# Patient Record
Sex: Female | Born: 1955 | Race: White | Hispanic: Refuse to answer | Smoking: Never smoker
Health system: Southern US, Community
[De-identification: ages and names within clinical notes are randomized; demographics above are authoritative.]

## PROBLEM LIST (undated history)

## (undated) DIAGNOSIS — E785 Hyperlipidemia, unspecified: Secondary | ICD-10-CM

## (undated) DIAGNOSIS — R87619 Unspecified abnormal cytological findings in specimens from cervix uteri: Secondary | ICD-10-CM

## (undated) DIAGNOSIS — I10 Essential (primary) hypertension: Secondary | ICD-10-CM

## (undated) DIAGNOSIS — D126 Benign neoplasm of colon, unspecified: Secondary | ICD-10-CM

## (undated) DIAGNOSIS — K219 Gastro-esophageal reflux disease without esophagitis: Secondary | ICD-10-CM

## (undated) DIAGNOSIS — T7840XA Allergy, unspecified, initial encounter: Secondary | ICD-10-CM

## (undated) HISTORY — DX: Hyperlipidemia, unspecified: E78.5

## (undated) HISTORY — PX: POLYPECTOMY: SHX149

## (undated) HISTORY — PX: BREAST ENHANCEMENT SURGERY: SHX7

## (undated) HISTORY — PX: UPPER GASTROINTESTINAL ENDOSCOPY: SHX188

## (undated) HISTORY — DX: Gastro-esophageal reflux disease without esophagitis: K21.9

## (undated) HISTORY — DX: Allergy, unspecified, initial encounter: T78.40XA

## (undated) HISTORY — DX: Benign neoplasm of colon, unspecified: D12.6

## (undated) HISTORY — PX: COLONOSCOPY: SHX174

## (undated) HISTORY — PX: FACIAL COSMETIC SURGERY: SHX629

## (undated) HISTORY — DX: Essential (primary) hypertension: I10

## (undated) HISTORY — PX: WISDOM TOOTH EXTRACTION: SHX21

## (undated) HISTORY — DX: Unspecified abnormal cytological findings in specimens from cervix uteri: R87.619

## (undated) HISTORY — PX: OTHER SURGICAL HISTORY: SHX169

## (undated) HISTORY — PX: ABDOMINOPLASTY: SUR9

## (undated) HISTORY — PX: COSMETIC SURGERY: SHX468

---

## 1999-12-29 HISTORY — PX: CRYOTHERAPY: SHX1416

## 2005-02-27 ENCOUNTER — Ambulatory Visit: Payer: Self-pay | Admitting: Internal Medicine

## 2005-10-13 ENCOUNTER — Ambulatory Visit: Payer: Self-pay | Admitting: Internal Medicine

## 2006-08-04 ENCOUNTER — Ambulatory Visit: Payer: Self-pay | Admitting: Internal Medicine

## 2006-08-27 ENCOUNTER — Ambulatory Visit: Payer: Self-pay | Admitting: Internal Medicine

## 2006-10-13 ENCOUNTER — Ambulatory Visit: Payer: Self-pay | Admitting: Internal Medicine

## 2007-10-29 ENCOUNTER — Encounter: Payer: Self-pay | Admitting: Internal Medicine

## 2007-10-29 DIAGNOSIS — K200 Eosinophilic esophagitis: Secondary | ICD-10-CM

## 2007-10-29 DIAGNOSIS — J309 Allergic rhinitis, unspecified: Secondary | ICD-10-CM | POA: Insufficient documentation

## 2007-11-01 ENCOUNTER — Ambulatory Visit: Payer: Self-pay | Admitting: Internal Medicine

## 2009-09-09 ENCOUNTER — Encounter (INDEPENDENT_AMBULATORY_CARE_PROVIDER_SITE_OTHER): Payer: Self-pay | Admitting: *Deleted

## 2010-05-16 ENCOUNTER — Telehealth: Payer: Self-pay | Admitting: Internal Medicine

## 2011-01-27 NOTE — Progress Notes (Signed)
Summary: Schedule Colonoscopy  Phone Note Outgoing Call Call back at Home Phone [phone]   Call placed by: Harlow Mares CMA Duncan Dull),  May 16, 2010 2:49 PM Call placed to: Patient Summary of Call: Left message on patients machine to call back. patient due for colonosocpy.  Initial call taken by: Harlow Mares CMA Duncan Dull),  May 16, 2010 2:49 PM  Follow-up for Phone Call        Left message on patients machine to call back. we will mail a letter to remind her she is due for a colonoscopy Follow-up by: Harlow Mares CMA Duncan Dull),  June 09, 2010 12:47 PM

## 2011-05-15 NOTE — Assessment & Plan Note (Signed)
[facility]                               PULMONARY OFFICE NOTE   Kimberly Collier, DRISCOLL                     MRN:          [id number]  DATE:08/04/2006                            DOB:          1956/06/30    PROBLEM:  The patient is a 55 year old woman referred through the courtesy  of Dr. Vernell Barrier for allergy evaluation, with concern of eosinophilic  esophagitis.   HISTORY:  She has been followed in this practice by Dr. Illene Regulus for  primary care and had a prior colonoscopy by Dr. Lina Sar.  She happened  to be in [facility] on May 28, 2006 when she presented to the [facility]  emergency room with meat impaction in the esophagus.  Dr. Vernell Barrier did  endoscopy with relief.  She was found to have an esophageal stricture with  biopsy ([id number]) at [facility], describing numerous  eosinophils consistent with eosinophilic esophagitis.  Tests were negative  for Barrett's esophagus and for Candida.  Gastric biopsy stain negative for  Helicobacter.  Other findings were a hiatal hernia, gastric and duodenal  ulcers with duodenal erosions.  She was treated with Nexium 40 mg b.i.d. and  scheduled for a followup dilatation.  She indicates she would follow up for  GI in the future with Dr. Juanda Chance because she lives here.  She also has noted  nasal congestion and some sensitivity to perfumes and strong odors.  She has  been told in the past that she might have cough variant asthma based on a  tendency to persistent cough and chest congestion after winter viral  syndromes.  She describes the primary event of a solid food impaction as  having happened two or three times before, usually with chicken.  This time,  it was with beef.  It has always been with meat.  She has not been aware of  other allergic manifestations such as hives, gas, or cramping associated  with particular foods, and she had not had other suggestion of asthma.   REVIEW OF SYSTEMS:  Some nonproductive cough without wheeze.  Difficulty  swallowing.  Intermittent nasal congestion that may be somewhat seasonal.  Some strong fragrances cause headaches, and she has to avoid cigarette  smoke.   MEDICATIONS:  1. Multivitamin.  2. Vesicare 5 mg.  3. Aleve.   ALLERGIES:  NO MEDICATION ALLERGY.   PAST MEDICAL HISTORY:  1. Previous episodes of difficulty swallowing meat bolus, usually      associated with chicken in the past.  2. Some hoarseness notable to her because she has to speak frequently in      her work.  3. No history of atopic dermatitis, eczema, or other rash.  No history of      unusual reaction to insects or to other foods, latex, or aspirin.  She      does not think she has ever had an iodine contrast type medication.  4. Postviral bronchitis pattern with winter colds.  She says at one point      was described to her as cough variant asthma.  5. Aesthetic surgery, including implants.  6. Upper endoscopy per HPI.   Note that she says that these episodes have happened three or four times  over a period of 10 years, always with restaurants.  On the first occasion,  no alcohol was involved.  Other times, she had alcohol with meal.   SOCIAL HISTORY:  She now lives in an apartment.  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  One  alcoholic beverage daily.  No tobacco.   OBJECTIVE:  VITAL SIGNS:  Weight 137 pounds.  Blood pressure 132/94, pulse  regular at 89, room air saturation 97%.  GENERAL:  This is an alert, comfortable-appearing woman.  SKIN:  No evident rash.  ADENOPATHY:  None found at the neck, shoulders, or axillae.  HEENT:  Conjunctivae clear.  Nasal mucosa clear and unobstructed.  Pharynx  looks somewhat more reddened and glandular than normal but not dramatically  so, with no postnasal drainage.  Voice may be very slightly hoarse, without  stridor.  NECK:  There is no neck  vein distension or thyromegaly.  CHEST:  Quiet, clear lung fields, unlabored breathing.  HEART:  Heart sounds regular without murmur or gallop.  ABDOMEN:  No enlargement of liver or spleen.  EXTREMITIES:  No clubbing, cyanosis, or edema.   IMPRESSION:  1. Eosinophilic esophagitis which is nonspecific and of uncertain      significance as a marker of allergic response to food.  This issue is      being discussed in literature.  Consider the possibility that her      stricture at least in part might reflect in immune response.  2. Esophageal stricture, hiatal hernia, gastric and duodenal ulceration      and erosion.  Negative for Helicobacter on biopsy.  She will definitely      need gastroenterology followup.  3. Possible mild allergic rhinitis.  4. Nonspecific sensitivity to irritants, including strong odors and      tobacco smoke.  5. Mild hoarseness, most likely reflects her gastrointestinal problems and      some tendency towards reflux that she has not been recognizing, but I      cannot exclude a component of postnasal drainage.   PLAN:  1. Food RAST panel.  2. Sample of Veramyst.  3. She understands to chew carefully, and she is going to be avoiding      foods she thinks are likely to cause esophageal distress.  4. Schedule return in three weeks to follow up labs, with option to do      allergy skin testing if appropriate at that time.   I appreciate the chance to meet this woman and hope I can be helpful.                                   Clinton D. Maple Hudson, MD, [facility], FACP   CDY/MedQ  DD:  08/08/2006  DT:  08/09/2006  Job #:  [id number]   cc:   Gaylyn Lambert, M.D.  Rosalyn Gess Norins, MD  Hedwig Morton. Juanda Chance, MD

## 2011-05-15 NOTE — Assessment & Plan Note (Signed)
[facility]                               PULMONARY OFFICE NOTE   Kimberly Collier, Kimberly Collier                     MRN:          [id number]  DATE:08/27/2006                            DOB:          07/04/1956    PROBLEMS:  1. Eosinophilic esophagitis.  2. Esophageal stricture/hiatal hernia/gastric and duodenal ulceration and      erosion, Helicobacter negative.  3. Possible mild allergic rhinitis.  4. Nonspecific respiratory irritant sensitivity.   HISTORY:  She had a viral respiratory syndrome after the last visit, which  has resolved completely.  Because of that, she did not really attempt to  assess the Veramyst and has not used that as a nasal steroid spray.  Rhinitis symptoms have been minimal and apparently the hoarseness has not  been significant.  She has had no more GI complaints or difficulty  swallowing.   MEDICATIONS:  Multivitamins, Vesicare, Aleve.   ALLERGIES:  No medication allergy.   OBJECTIVE:  VITAL SIGNS:  Weight 136 pounds, BP 132/86, pulse regular at 83,  room air saturation 96%.  GENERAL:  She looks comfortable.  HEENT:  Speech is clear.  Nasal airway does not seem obstructed.  Voice  quality is normal.  Breathing is unlabored.  LUNGS:  Lung fields are clear.  CARDIAC:  Heart sounds are regular without murmur.   LABORATORY:  A screening food allergy in vitro assay was negative for egg  white, milk, wheat, soybean, peanut and fish.  Her total IgE level was  moderate at 103.4 (0-180).  Sedimentation rate was normal at 2.   IMPRESSION:  I told her we are really not sure of what eosinophilic  infiltration in the esophagus means, especially in the absence of other  symptoms suggestive of an allergic response.  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  Her symptomatic context was eating meat when she was  out on social occasions, where she was also having alcohol and probably  under a little pressure to speak a little  quickly and chew less carefully.  We could offer skin testing for foods or check on availability of double-  blind food assay in capsules, but I am not sure that that really is a useful  direction for her at this time.  It is more important that she maintain  control of the more common esophagitis and gastritis findings.   PLAN:  1. She is going to check with Dr. Lina Sar about advice for followup      with GI, possibly including a repeat esophagoscopy.  2. We agreed on the simple measures of avoiding difficult-to-chew foods      when she is out with others, favoring fish, which at least is less      likely to hang in a strictured esophagus, and she can try taking a      Claritin or an hour or so before meals if she wishes, although I am not      sure what impact that would have.  I doubt that an allergic food      response is a  significant part of what she has experienced, but I would      be happy to see her again if I can be helpful.                                   Clinton D. Maple Hudson, MD, [facility], FACP   CDY/MedQ  DD:  08/28/2006  DT:  08/30/2006  Job #:  [id number]   cc:   Kimberly Lambert, MD  Kimberly Gess. Norins, MD  Kimberly Morton. Juanda Chance, MD

## 2011-07-21 ENCOUNTER — Ambulatory Visit (AMBULATORY_SURGERY_CENTER): Payer: PRIVATE HEALTH INSURANCE | Admitting: *Deleted

## 2011-07-21 VITALS — Ht 65.0 in | Wt 119.0 lb

## 2011-07-21 DIAGNOSIS — Z1211 Encounter for screening for malignant neoplasm of colon: Secondary | ICD-10-CM

## 2011-07-21 MED ORDER — PEG-KCL-NACL-NASULF-NA ASC-C 100 G PO SOLR: ORAL | Status: DC

## 2011-08-04 ENCOUNTER — Ambulatory Visit (AMBULATORY_SURGERY_CENTER): Payer: PRIVATE HEALTH INSURANCE | Admitting: Internal Medicine

## 2011-08-04 ENCOUNTER — Encounter: Payer: Self-pay | Admitting: Internal Medicine

## 2011-08-04 DIAGNOSIS — K635 Polyp of colon: Secondary | ICD-10-CM

## 2011-08-04 DIAGNOSIS — D126 Benign neoplasm of colon, unspecified: Secondary | ICD-10-CM

## 2011-08-04 DIAGNOSIS — D129 Benign neoplasm of anus and anal canal: Secondary | ICD-10-CM

## 2011-08-04 DIAGNOSIS — Z86010 Personal history of colonic polyps: Secondary | ICD-10-CM

## 2011-08-04 DIAGNOSIS — Z1211 Encounter for screening for malignant neoplasm of colon: Secondary | ICD-10-CM

## 2011-08-04 DIAGNOSIS — D128 Benign neoplasm of rectum: Secondary | ICD-10-CM

## 2011-08-04 MED ORDER — SODIUM CHLORIDE 0.9 % IV SOLN: 500.0000 mL | INTRAVENOUS | Status: DC

## 2011-08-04 NOTE — Patient Instructions (Signed)
Discharge instructions given to the patient. Handouts on polyps and high-fiber diet given. Resume previous medications.

## 2011-08-05 ENCOUNTER — Telehealth: Payer: Self-pay

## 2011-08-05 NOTE — Telephone Encounter (Signed)
Left message on answering machine. 

## 2011-08-10 ENCOUNTER — Encounter: Payer: Self-pay | Admitting: Internal Medicine

## 2014-01-07 ENCOUNTER — Ambulatory Visit (INDEPENDENT_AMBULATORY_CARE_PROVIDER_SITE_OTHER): Payer: No Typology Code available for payment source | Admitting: Family Medicine

## 2014-01-07 VITALS — BP 122/70 | HR 108 | Temp 99.1°F | Resp 18 | Wt 132.0 lb

## 2014-01-07 DIAGNOSIS — R05 Cough: Secondary | ICD-10-CM

## 2014-01-07 DIAGNOSIS — R0981 Nasal congestion: Secondary | ICD-10-CM

## 2014-01-07 DIAGNOSIS — J209 Acute bronchitis, unspecified: Secondary | ICD-10-CM

## 2014-01-07 DIAGNOSIS — J3489 Other specified disorders of nose and nasal sinuses: Secondary | ICD-10-CM

## 2014-01-07 DIAGNOSIS — R509 Fever, unspecified: Secondary | ICD-10-CM

## 2014-01-07 DIAGNOSIS — R059 Cough, unspecified: Secondary | ICD-10-CM

## 2014-01-07 LAB — POCT INFLUENZA A/B
Influenza A, POC: NEGATIVE
Influenza B, POC: NEGATIVE

## 2014-01-07 MED ORDER — FLUTICASONE PROPIONATE 50 MCG/ACT NA SUSP: 2.0000 | Freq: Every day | NASAL | Status: DC

## 2014-01-07 MED ORDER — HYDROCODONE-HOMATROPINE 5-1.5 MG/5ML PO SYRP: 5.0000 mL | ORAL_SOLUTION | Freq: Every evening | ORAL | Status: DC | PRN

## 2014-01-07 MED ORDER — AZITHROMYCIN 250 MG PO TABS: ORAL_TABLET | ORAL | Status: DC

## 2014-01-07 MED ORDER — BENZONATATE 100 MG PO CAPS: 200.0000 mg | ORAL_CAPSULE | Freq: Two times a day (BID) | ORAL | Status: DC | PRN

## 2014-01-07 NOTE — Patient Instructions (Signed)
Acute Bronchitis Bronchitis is inflammation of the airways that extend from the windpipe into the lungs (bronchi). The inflammation often causes mucus to develop. This leads to a cough, which is the most common symptom of bronchitis.  In acute bronchitis, the condition usually develops suddenly and goes away over time, usually in a couple weeks. Smoking, allergies, and asthma can make bronchitis worse. Repeated episodes of bronchitis may cause further lung problems.  CAUSES Acute bronchitis is most often caused by the same virus that causes a cold. The virus can spread from person to person (contagious).  SIGNS AND SYMPTOMS   Cough.   Fever.   Coughing up mucus.   Body aches.   Chest congestion.   Chills.   Shortness of breath.   Sore throat.  DIAGNOSIS  Acute bronchitis is usually diagnosed through a physical exam. Tests, such as chest X-rays, are sometimes done to rule out other conditions.  TREATMENT  Acute bronchitis usually goes away in a couple weeks. Often times, no medical treatment is necessary. Medicines are sometimes given for relief of fever or cough. Antibiotics are usually not needed but may be prescribed in certain situations. In some cases, an inhaler may be recommended to help reduce shortness of breath and control the cough. A cool mist vaporizer may also be used to help thin bronchial secretions and make it easier to clear the chest.  HOME CARE INSTRUCTIONS  Get plenty of rest.   Drink enough fluids to keep your urine clear or pale yellow (unless you have a medical condition that requires fluid restriction). Increasing fluids may help thin your secretions and will prevent dehydration.   Only take over-the-counter or prescription medicines as directed by your health care provider.   Avoid smoking and secondhand smoke. Exposure to cigarette smoke or irritating chemicals will make bronchitis worse. If you are a smoker, consider using nicotine gum or skin  patches to help control withdrawal symptoms. Quitting smoking will help your lungs heal faster.   Reduce the chances of another bout of acute bronchitis by washing your hands frequently, avoiding people with cold symptoms, and trying not to touch your hands to your mouth, nose, or eyes.   Follow up with your health care provider as directed.  SEEK MEDICAL CARE IF: Your symptoms do not improve after 1 week of treatment.  SEEK IMMEDIATE MEDICAL CARE IF:  You develop an increased fever or chills.   You have chest pain.   You have severe shortness of breath.  You have bloody sputum.   You develop dehydration.  You develop fainting.  You develop repeated vomiting.  You develop a severe headache. MAKE SURE YOU:   Understand these instructions.  Will watch your condition.  Will get help right away if you are not doing well or get worse. Document Released: 01/21/2005 Document Revised: 08/16/2013 Document Reviewed: 06/06/2013 [employer] Patient Information 2014 [employer].  

## 2014-01-07 NOTE — Progress Notes (Deleted)
new

## 2014-01-07 NOTE — Progress Notes (Signed)
Chief Complaint:  Chief Complaint  Patient presents with  . Nasal Congestion    x11 days   . Fatigue  . Laryngitis  . Fever    HPI: Kimberly Collier is a 58 y.o. female who is here for fever, congestion, laryngitis for 11 days.  Has tried zicam, cholracetin. She denies smoking, has had bronchitis in the past; denies SOB, +  wheezing No ear pain or facial pain. She thought it was viral and has waited it out but feeling somewhat worse. She does have allergies.   Past Medical History  Diagnosis Date  . Allergy     pollen in spring  . Hyperlipidemia   . Hypertension   . Adenomatous colon polyp    Past Surgical History  Procedure Laterality Date  . Colonoscopy    . Breast enhancement surgery    . Tummy    . Abdominoplasty    . Facial cosmetic surgery    . Upper gastrointestinal endoscopy    . Cosmetic surgery     History   Social History  . Marital Status: Divorced    Spouse Name: N/A    Number of Children: N/A  . Years of Education: N/A   Social History Main Topics  . Smoking status: Never Smoker   . Smokeless tobacco: Never Used  . Alcohol Use: 4.2 oz/week    7 Glasses of wine per week  . Drug Use: No  . Sexual Activity: None   Other Topics Concern  . None   Social History Narrative  . None   Family History  Problem Relation Age of Onset  . Hyperlipidemia Father   . Hypertension Father    Allergies  Allergen Reactions  . Celebrex [Celecoxib] Rash   Prior to Admission medications   Medication Sig Start Date End Date Taking? Authorizing Provider  b complex vitamins tablet Take 1 tablet by mouth daily.     Yes Historical Provider, MD  calcium & magnesium carbonates (MYLANTA) 311-232 MG per tablet Take 1 tablet by mouth daily.     Yes Historical Provider, MD  Cholecalciferol (VITAMIN D) 2000 UNITS tablet Take 2,000 Units by mouth daily.     Yes Historical Provider, MD  fish oil-omega-3 fatty acids 1000 MG capsule Take 1 g by mouth daily.     Yes  Historical Provider, MD  losartan-hydrochlorothiazide (HYZAAR) 50-12.5 MG per tablet Take 1 tablet by mouth Daily. 07/16/11  Yes Historical Provider, MD  Multiple Vitamins-Minerals (MULTIVITAMIN WITH MINERALS) tablet Take 1 tablet by mouth daily.     Yes Historical Provider, MD  pravastatin (PRAVACHOL) 20 MG tablet Take 20 mg by mouth Daily. 07/16/11  Yes Historical Provider, MD  VITAMIN A PO Take 1 tablet by mouth daily.     Yes Historical Provider, MD  vitamin B-12 (CYANOCOBALAMIN) 500 MCG tablet Take 500 mcg by mouth daily.     Yes Historical Provider, MD  vitamin C (ASCORBIC ACID) 500 MG tablet Take 500 mg by mouth daily.     Yes Historical Provider, MD  vitamin E (VITAMIN E) 400 UNIT capsule Take 400 Units by mouth daily.     Yes Historical Provider, MD  vitamin k 100 MCG tablet Take 100 mcg by mouth daily.     Yes Historical Provider, MD  peg 3350 powder (MOVIPREP) 100 G SOLR MOVI PREP take as directed 07/21/11   Lafayette Dragon, MD     ROS: The patient denies night sweats, unintentional weight loss, chest  pain, palpitations, dyspnea on exertion, nausea, vomiting, abdominal pain, dysuria, hematuria, melena, numbness, weakness, or tingling.  All other systems have been reviewed and were otherwise negative with the exception of those mentioned in the HPI and as above.    PHYSICAL EXAM: Filed Vitals:   01/07/14 1227  BP: 122/70  Pulse: 108  Temp: 99.1 F (37.3 C)  Resp: 18   Filed Vitals:   01/07/14 1227  Weight: 132 lb (59.875 kg)   Body mass index is 21.97 kg/(m^2).  General: Alert, no acute distress HEENT:  Normocephalic, atraumatic, oropharynx patent. EOMI, PERRLA. Tm nl, erythematous throat, no exudates, minimally tender sinuses Cardiovascular:  Regular rate and rhythm, no rubs murmurs or gallops.  No Carotid bruits, radial pulse intact. No pedal edema.  Respiratory: Clear to auscultation bilaterally.  No wheezes, rales, or rhonchi.  No cyanosis, no use of accessory  musculature GI: No organomegaly, abdomen is soft and non-tender, positive bowel sounds.  No masses. Skin: No rashes. Neurologic: Facial musculature symmetric. Psychiatric: Patient is appropriate throughout our interaction. Lymphatic: No cervical lymphadenopathy Musculoskeletal: Gait intact.   LABS: Results for orders placed in visit on 01/07/14  POCT INFLUENZA A/B      Result Value Range   Influenza A, POC Negative     Influenza B, POC Negative       EKG/XRAY:   Primary read interpreted by Dr. Marin Comment at [facility].   ASSESSMENT/PLAN: Encounter Diagnoses  Name Primary?  . Chills with fever Yes  . Acute bronchitis   . Nasal congestion   . Cough    Most likely viral but since it has been over 11 days and patient is feeling worse advise to try sxs first and if no improvement then may use abx.  Rx Azithromycin Rx Tessalon Perles, Flonase and hycodan F/u prn  Gross sideeffects, risk and benefits, and alternatives of medications d/w patient. Patient is aware that all medications have potential sideeffects and we are unable to predict every sideeffect or drug-drug interaction that may occur.  LE, [place], DO 01/07/2014 1:39 PM

## 2014-04-21 ENCOUNTER — Emergency Department (HOSPITAL_COMMUNITY)
Admission: EM | Admit: 2014-04-21 | Discharge: 2014-04-21 | Disposition: A | Discharge status: Home / Self Care | Payer: PRIVATE HEALTH INSURANCE | Attending: Emergency Medicine | Admitting: Emergency Medicine

## 2014-04-21 ENCOUNTER — Encounter (HOSPITAL_COMMUNITY): Payer: Self-pay | Admitting: Emergency Medicine

## 2014-04-21 DIAGNOSIS — T781XXA Other adverse food reactions, not elsewhere classified, initial encounter: Secondary | ICD-10-CM | POA: Insufficient documentation

## 2014-04-21 DIAGNOSIS — Y9389 Activity, other specified: Secondary | ICD-10-CM | POA: Insufficient documentation

## 2014-04-21 DIAGNOSIS — Y929 Unspecified place or not applicable: Secondary | ICD-10-CM | POA: Insufficient documentation

## 2014-04-21 DIAGNOSIS — Z8639 Personal history of other endocrine, nutritional and metabolic disease: Secondary | ICD-10-CM | POA: Insufficient documentation

## 2014-04-21 DIAGNOSIS — Z79899 Other long term (current) drug therapy: Secondary | ICD-10-CM | POA: Insufficient documentation

## 2014-04-21 DIAGNOSIS — Z860100 Personal history of colon polyps, unspecified: Secondary | ICD-10-CM | POA: Insufficient documentation

## 2014-04-21 DIAGNOSIS — Z862 Personal history of diseases of the blood and blood-forming organs and certain disorders involving the immune mechanism: Secondary | ICD-10-CM | POA: Insufficient documentation

## 2014-04-21 DIAGNOSIS — T628X1A Toxic effect of other specified noxious substances eaten as food, accidental (unintentional), initial encounter: Secondary | ICD-10-CM | POA: Insufficient documentation

## 2014-04-21 DIAGNOSIS — T7840XA Allergy, unspecified, initial encounter: Secondary | ICD-10-CM

## 2014-04-21 DIAGNOSIS — I10 Essential (primary) hypertension: Secondary | ICD-10-CM | POA: Insufficient documentation

## 2014-04-21 MED ORDER — EPINEPHRINE 0.3 MG/0.3ML IJ SOAJ: 0.3000 mg | Freq: Once | INTRAMUSCULAR | Status: AC

## 2014-04-21 NOTE — Discharge Instructions (Signed)
Return here for any trouble swallowing, inability to control your secretions, rash, or any other problems. Epinephrine Injection Epinephrine is a medicine given by injection to temporarily treat an emergency allergic reaction. It is also used to treat severe asthmatic attacks and other lung problems. The medicine helps to enlarge (dilate) the small breathing tubes of the lungs. A life-threatening, sudden allergic reaction that involves the whole body is called anaphylaxis. Because of potential side effects, epinephrine should only be used as directed by your caregiver. RISKS AND COMPLICATIONS Possible side effects of epinephrine injections include:  Chest pain.  Irregular or rapid heartbeat.  Shortness of breath.  Nausea.  Vomiting.  Abdominal pain or cramping.  Sweating.  Dizziness.  Weakness.  Headache.  Nervousness. Report all side effects to your caregiver. HOW TO GIVE AN EPINEPHRINE INJECTION Give the epinephrine injection immediately when symptoms of a severe reaction begin. Inject the medicine into the outer thigh or any available, large muscle. Your caregiver can teach you how to do this. You do not need to remove any clothing. After the injection, call your local emergency services (911 in [place] Even if you improve after the injection, you need to be examined at a hospital emergency department. Epinephrine works quickly, but it also wears off quickly. Delayed reactions can occur. A delayed reaction may be as serious and dangerous as the initial reaction. HOME CARE INSTRUCTIONS  Make sure you and your family know how to give an epinephrine injection.  Use epinephrine injections as directed by your caregiver. Do not use this medicine more often or in larger doses than prescribed.  Always carry your epinephrine injection or anaphylaxis kit with you. This can be lifesaving if you have a severe reaction.  Store the medicine in a cool, dry place. If the medicine becomes  discolored or cloudy, dispose of it properly and replace it with new medicine.  Check the expiration date on your medicine. It may be unsafe to use medicines past their expiration date.  Tell your caregiver about any other medicines you are taking. Some medicines can react badly with epinephrine.  Tell your caregiver about any medical conditions you have, such as diabetes, high blood pressure (hypertension), heart disease, irregular heartbeats, or if you are pregnant. SEEK IMMEDIATE MEDICAL CARE IF:  You have used an epinephrine injection. Call your local emergency services (911 in [place] Even if you improve after the injection, you need to be examined at a hospital emergency department to make sure your allergic reaction is under control. You will also be monitored for adverse effects from the medicine.  You have chest pain.  You have irregular or fast heartbeats.  You have shortness of breath.  You have severe headaches.  You have severe nausea, vomiting, or abdominal cramps.  You have severe pain, swelling, or redness in the area where you gave the injection. Document Released: 12/11/2000 Document Revised: 03/07/2012 Document Reviewed: 09/02/2011 [facility] Patient Information 2014 [place], [place].

## 2014-04-21 NOTE — ED Notes (Signed)
Pt given AVS. Educated on use and administration of epi pen. O2 saturation 100% on RA at this time. NAD. No cyanosis noted. Able to take sips of water without reflexive coughing or drooling. Ambulatory with steady gait.

## 2014-04-21 NOTE — ED Provider Notes (Signed)
CSN: [id number]     Arrival date & time 04/21/14  1259 History   First MD Initiated Contact with Patient 04/21/14 1322     Chief Complaint  Patient presents with  . Allergic Reaction  . Oral Swelling     (Consider location/radiation/quality/duration/timing/severity/associated sxs/prior Treatment) Patient is a 58 y.o. female presenting with allergic reaction. The history is provided by the patient and the spouse.  Allergic Reaction  patient here after having an acute onset of trouble swallowing after eating a piece of steak. History of similar symptoms before possibly due to an allergic reaction. Symptoms have gradually improved. She denies any dyspnea. No rashes noted. No treatment used prior to arrival. No wheezing appreciated. Nothing made her symptoms better or worse.  Past Medical History  Diagnosis Date  . Allergy     pollen in spring  . Hyperlipidemia   . Hypertension   . Adenomatous colon polyp    Past Surgical History  Procedure Laterality Date  . Colonoscopy    . Breast enhancement surgery    . Tummy    . Abdominoplasty    . Facial cosmetic surgery    . Upper gastrointestinal endoscopy    . Cosmetic surgery     Family History  Problem Relation Age of Onset  . Hyperlipidemia Father   . Hypertension Father    History  Substance Use Topics  . Smoking status: Never Smoker   . Smokeless tobacco: Never Used  . Alcohol Use: 4.2 oz/week    7 Glasses of wine per week   OB History   Grav Para Term Preterm Abortions TAB SAB Ect Mult Living                 Review of Systems  All other systems reviewed and are negative.     Allergies  Celebrex  Home Medications   Prior to Admission medications   Medication Sig Start Date End Date Taking? Authorizing Provider  b complex vitamins tablet Take 1 tablet by mouth daily.     Yes Historical Provider, MD  calcium & magnesium carbonates (MYLANTA) 311-232 MG per tablet Take 1 tablet by mouth daily.     Yes Historical  Provider, MD  Cholecalciferol (VITAMIN D) 2000 UNITS tablet Take 2,000 Units by mouth daily.     Yes Historical Provider, MD  fish oil-omega-3 fatty acids 1000 MG capsule Take 1 g by mouth daily.     Yes Historical Provider, MD  losartan-hydrochlorothiazide (HYZAAR) 50-12.5 MG per tablet Take 1 tablet by mouth Daily. 07/16/11  Yes Historical Provider, MD  Multiple Vitamins-Minerals (MULTIVITAMIN WITH MINERALS) tablet Take 1 tablet by mouth daily.     Yes Historical Provider, MD  VITAMIN A PO Take 1 tablet by mouth daily.     Yes Historical Provider, MD  vitamin B-12 (CYANOCOBALAMIN) 500 MCG tablet Take 500 mcg by mouth daily.     Yes Historical Provider, MD  vitamin C (ASCORBIC ACID) 500 MG tablet Take 500 mg by mouth daily.     Yes Historical Provider, MD  vitamin E (VITAMIN E) 400 UNIT capsule Take 400 Units by mouth daily.     Yes Historical Provider, MD  vitamin k 100 MCG tablet Take 100 mcg by mouth daily.     Yes Historical Provider, MD  EPINEPHrine (EPI-PEN) 0.3 mg/0.3 mL SOAJ injection Inject 0.3 mLs (0.3 mg total) into the muscle once. 04/21/14   Leota Jacobsen, MD   BP 137/83  Pulse 95  Temp(Src) 97.4  F (36.3 C) (Oral)  Resp 16  Ht 5\' 5"  (1.651 m)  Wt 125 lb (56.7 kg)  BMI 20.80 kg/m2  SpO2 100% Physical Exam  Nursing note and vitals reviewed. Constitutional: She is oriented to person, place, and time. She appears well-developed and well-nourished.  Non-toxic appearance. No distress.  HENT:  Head: Normocephalic and atraumatic.  Eyes: Conjunctivae, EOM and lids are normal. Pupils are equal, round, and reactive to light.  Neck: Normal range of motion. Neck supple. No tracheal deviation present. No mass present.  Cardiovascular: Normal rate, regular rhythm and normal heart sounds.  Exam reveals no gallop.   No murmur heard. Pulmonary/Chest: Effort normal and breath sounds normal. No stridor. No respiratory distress. She has no decreased breath sounds. She has no wheezes. She has  no rhonchi. She has no rales.  Abdominal: Soft. Normal appearance and bowel sounds are normal. She exhibits no distension. There is no tenderness. There is no rebound and no CVA tenderness.  Musculoskeletal: Normal range of motion. She exhibits no edema and no tenderness.  Neurological: She is alert and oriented to person, place, and time. She has normal strength. No cranial nerve deficit or sensory deficit. GCS eye subscore is 4. GCS verbal subscore is 5. GCS motor subscore is 6.  Skin: Skin is warm and dry. No abrasion and no rash noted.  Psychiatric: She has a normal mood and affect. Her speech is normal and behavior is normal.    ED Course  Procedures (including critical care time) Labs Review Labs Reviewed - No data to display  Imaging Review No results found.   EKG Interpretation None      MDM   Final diagnoses:  Allergic reaction    Patient given water here to drink and she took approximately 12 ounces without evidence of emesis. No evidence of airway obstruction. Lungs are without wheezing. She is stable for discharge. She'll be given an epinephrine pen.    Leota Jacobsen, MD 04/21/14 1352

## 2014-04-21 NOTE — ED Notes (Addendum)
Pt reports her throat started swelling after lunch. Eating at [employer]-steak, asparagus. Thinks that the seasonings were changed and this may have contributed. Reports this has happened before but she is not aware what she is allergic to or what foods or spices initiate this reaction. Still feels like her throat is so swollen "I can't swallow my own saliva." Says the swelling isn't getting any worse at this time. Was admitted to [facility] in the past for a similar occurrence but doesn't remember what she was given to alleviate the swelling. In NAD at this time. O2 saturation 100%. Patient breathing with no distress and no cyanosis noted. Skin pink, dry and intact. Awaiting MD.

## 2014-11-27 DIAGNOSIS — R87619 Unspecified abnormal cytological findings in specimens from cervix uteri: Secondary | ICD-10-CM

## 2014-11-27 HISTORY — DX: Unspecified abnormal cytological findings in specimens from cervix uteri: R87.619

## 2015-03-07 ENCOUNTER — Telehealth: Payer: Self-pay | Admitting: Obstetrics and Gynecology

## 2015-03-07 NOTE — Telephone Encounter (Signed)
Called and left patient a message to call back to reschedule her new patient appointment that was cancelled due to a schedule conflict.

## 2015-03-28 ENCOUNTER — Encounter: Payer: No Typology Code available for payment source | Admitting: Obstetrics and Gynecology

## 2015-04-08 ENCOUNTER — Encounter: Payer: No Typology Code available for payment source | Admitting: Obstetrics and Gynecology

## 2015-06-19 ENCOUNTER — Encounter: Payer: Self-pay | Admitting: Obstetrics and Gynecology

## 2015-06-19 ENCOUNTER — Encounter: Payer: No Typology Code available for payment source | Admitting: Obstetrics and Gynecology

## 2015-06-19 NOTE — Progress Notes (Signed)
Patient ID: Kimberly Collier, female   DOB: Dec 17, 1956, 59 y.o.   MRN: [id number] 59 y.o. No obstetric history on file. Divorced Caucasian female here for a new GYN visit. Patient  had an abnormal PAP  12-04-14 ASCUS NEG HR HPV  PCP:  Dr. Brent [facility]   Patient's last menstrual period was 12/28/2005 (approximate).          Sexually active: Yes.    The current method of family planning is post menopausal status.    Exercising: Yes.    dance Smoker:  no  Health Maintenance: Pap:  12-04-14  ASCUS NEG HR HPV History of abnormal Pap:  Yes 12-04-14 ASCUS (Abnormal PAP 15 yrs ago- cryotherapy)  MMG: 11/2014 [facility]- WNL per patient Colonoscopy:  08-04-11 polyps repeat in 5 years BMD:  12/15  Result  Normal per patient TDaP:  PCP has record.    reports that she has never smoked. She has never used smokeless tobacco. She reports that she drinks about 8.4 oz of alcohol per week. She reports that she does not use illicit drugs.  Past Medical History  Diagnosis Date  . Allergy     pollen in spring  . Hyperlipidemia   . Hypertension   . Adenomatous colon polyp   . Abnormal Pap smear of cervix 11-2014    ASCUS    Past Surgical History  Procedure Laterality Date  . Colonoscopy    . Breast enhancement surgery    . Tummy    . Abdominoplasty    . Facial cosmetic surgery    . Upper gastrointestinal endoscopy    . Cosmetic surgery    . Cryotherapy  2001    Current Outpatient Prescriptions  Medication Sig Dispense Refill  . b complex vitamins tablet Take 1 tablet by mouth daily.      . calcium & magnesium carbonates (MYLANTA) 311-232 MG per tablet Take 1 tablet by mouth daily.      . Cholecalciferol (VITAMIN D) 2000 UNITS tablet Take 2,000 Units by mouth daily.      Marland Kitchen EPINEPHrine (EPI-PEN) 0.3 mg/0.3 mL SOAJ injection Inject 0.3 mLs (0.3 mg total) into the muscle once. 1 Device 2  . fish oil-omega-3 fatty acids 1000 MG capsule Take 1 g by mouth daily.      Marland Kitchen losartan-hydrochlorothiazide (HYZAAR)  50-12.5 MG per tablet Take 1 tablet by mouth Daily.    . Multiple Vitamins-Minerals (MULTIVITAMIN WITH MINERALS) tablet Take 1 tablet by mouth daily.      Marland Kitchen VITAMIN A PO Take 1 tablet by mouth daily.      . vitamin B-12 (CYANOCOBALAMIN) 500 MCG tablet Take 500 mcg by mouth daily.      . vitamin C (ASCORBIC ACID) 500 MG tablet Take 500 mg by mouth daily.      . vitamin E (VITAMIN E) 400 UNIT capsule Take 400 Units by mouth daily.      . vitamin k 100 MCG tablet Take 100 mcg by mouth daily.       No current facility-administered medications for this visit.    Family History  Problem Relation Age of Onset  . Hyperlipidemia Father   . Hypertension Father   . Alzheimer's disease Father   . Thyroid disease Mother     ROS:   ---  Exam:   BP 124/78 mmHg  Pulse 104  Resp 16  Ht 5\' 4"  (1.626 m)  Wt 127 lb (57.607 kg)  BMI 21.79 kg/m2  LMP 12/28/2005 (Approximate)

## 2015-06-19 NOTE — Progress Notes (Signed)
Patient ID: Kimberly Collier, female   DOB: 1956-05-25, 59 y.o.   MRN: [id number]  Patient left the office without being seen by provider.    Appointment time was 2:00 for new patient visit.  Stated to nurse that she had to leave office by 3:10.  Nurse informed me of this at 2:40 when her work up and abstraction was complete. Patient was immediately informed by nurse (upon my recommendation) that her appointment may not be able to be completed in her time frame. New patient visits typically require over an hour in the office. EPIC problems did occur after abstraction was done which did not allow patient work up to be viewed by me.  EPIC ticket was placed by other member of the nursing team. Patient indicated dissatisfaction to the nurse with her experience at the office and that she did not wish to return.

## 2015-10-08 ENCOUNTER — Ambulatory Visit (INDEPENDENT_AMBULATORY_CARE_PROVIDER_SITE_OTHER): Payer: 59 | Admitting: Family Medicine

## 2015-10-08 ENCOUNTER — Encounter: Payer: Self-pay | Admitting: Family Medicine

## 2015-10-08 VITALS — BP 146/96 | HR 88 | Ht 65.0 in | Wt 125.0 lb

## 2015-10-08 DIAGNOSIS — M79621 Pain in right upper arm: Secondary | ICD-10-CM

## 2015-10-08 DIAGNOSIS — M79601 Pain in right arm: Secondary | ICD-10-CM | POA: Diagnosis not present

## 2015-10-09 DIAGNOSIS — M79621 Pain in right upper arm: Secondary | ICD-10-CM | POA: Insufficient documentation

## 2015-10-09 NOTE — Progress Notes (Signed)
Kimberly Collier - 59 y.o. 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 She believes it was while she reached back to "swing a light saber".  She also does routines where she is swung around by her dance partner.  Since that time overhead activites bother her.  She never reports having any bruising.  She practices 12 hours/week.  Heat helps a little.  No compression. No bracing. Denies fevers, chills, or night sweats.  Has not rested for a prolonged period. No painful clicking or popping. No restricted RoM.  In addition to pain with overhead movements she also has trouble reaching around her back.   ROS:     14 point RoS negative other than that listed in HPI  HISTORY: Past Medical, Surgical, Social, and Family History Reviewed & Updated per EMR.    OBJECTIVE: BP 146/96 mmHg  Pulse 88  Ht 5\' 5"  (1.651 m)  Wt 125 lb (56.7 kg)  BMI 20.80 kg/m2  LMP 12/28/2005 (Approximate)  Physical Exam  Calm, no distress Non-labored breathing. No distress.   Shoulder: Right Inspection reveals no abnormalities, atrophy or asymmetry. Palpation is normal with no tenderness over AC joint or bicipital groove. Slight tenderness over the deltoid tuberosity.  ROM is full in all planes except internal rotation which is limited to T12 on the right compared to T5 on the left.  Rotator cuff strength normal throughout. No signs of impingement with negative Neer and Hawkin's tests, empty can. Speeds and Yergason's tests normal. No labral pathology noted with negative Obrien's, negative clunk and good stability. Normal scapular function observed. No painful arc and no drop arm sign. No apprehension sign  Imaging: [place] image of the R shoulder in both long and short axis obtained. Biceps tendon appears normal fibrillar pattern without surrounding effusion. Subscapularis  appears normal without obvious tears or abnormalities. The supraspinatus tendon appears normal with no tears and a good footprint.  The infraspinatus and teres minor tendons appear normal with no tears and a good footprints.  No abnormality over the deltoid tuberosity.  Normal limited u/s of the shoulder.    MEDICATIONS, LABS & OTHER ORDERS: Previous Medications   B COMPLEX VITAMINS TABLET    Take 1 tablet by mouth daily.     CALCIUM & MAGNESIUM CARBONATES (MYLANTA) 311-232 MG PER TABLET    Take 1 tablet by mouth daily.     CHOLECALCIFEROL (VITAMIN D) 2000 UNITS TABLET    Take 2,000 Units by mouth daily.     EPINEPHRINE (EPI-PEN) 0.3 MG/0.3 ML SOAJ INJECTION    Inject 0.3 mLs (0.3 mg total) into the muscle once.   FISH OIL-OMEGA-3 FATTY ACIDS 1000 MG CAPSULE    Take 1 g by mouth daily.     LOSARTAN-HYDROCHLOROTHIAZIDE (HYZAAR) 50-12.5 MG PER TABLET    Take 1 tablet by mouth Daily.   MULTIPLE VITAMINS-MINERALS (MULTIVITAMIN WITH MINERALS) TABLET    Take 1 tablet by mouth daily.     VITAMIN A PO    Take 1 tablet by mouth daily.     VITAMIN B-12 (CYANOCOBALAMIN) 500 MCG TABLET    Take 500 mcg by mouth daily.     VITAMIN C (ASCORBIC ACID) 500 MG TABLET    Take 500 mg by mouth daily.     VITAMIN E (VITAMIN E) 400 UNIT CAPSULE    Take  400 Units by mouth daily.     VITAMIN K 100 MCG TABLET    Take 100 mcg by mouth daily.     Modified Medications   No medications on file   New Prescriptions   No medications on file   Discontinued Medications   No medications on file  No orders of the defined types were placed in this encounter.   ASSESSMENT & PLAN: See problem based charting & AVS for pt instructions.

## 2015-10-09 NOTE — Assessment & Plan Note (Signed)
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  Since that time she has re aggravated the injury multiple times, no rest period.  Tender over deltoid tuberosity, but no loss of strength.  RC intact by exam and u/s.  May also have a small cuff tear that is referring pain to this area.  - Rest x 2 weeks: avoid aggravating maneuvers such as anything overhead or with internal rotation.   - Given RC exercises - Bodyhelix compression sleeve provided  - f/u PRN if not better after extended rest (post competition in several weeks) or sooner if worsening.

## 2016-07-27 ENCOUNTER — Encounter: Payer: Self-pay | Admitting: Gastroenterology

## 2016-09-14 ENCOUNTER — Encounter: Payer: Self-pay | Admitting: Gastroenterology

## 2016-11-23 ENCOUNTER — Ambulatory Visit (AMBULATORY_SURGERY_CENTER): Payer: 59 | Admitting: *Deleted

## 2016-11-23 VITALS — Ht 65.0 in | Wt 132.0 lb

## 2016-11-23 DIAGNOSIS — Z86010 Personal history of colonic polyps: Secondary | ICD-10-CM

## 2016-11-23 MED ORDER — NA SULFATE-K SULFATE-MG SULF 17.5-3.13-1.6 GM/177ML PO SOLN: 1.0000 | Freq: Once | ORAL | 0 refills | Status: AC

## 2016-11-23 NOTE — Progress Notes (Signed)
No egg or soy allergy known to patient  No issues with past sedation with any surgeries  or procedures, no intubation problems  No diet pills per patient No home 02 use per patient  No blood thinners per patient  Pt denies issues with constipation  No A fib or A flutter   emmi declined'   

## 2016-12-07 ENCOUNTER — Encounter: Payer: Self-pay | Admitting: Gastroenterology

## 2016-12-07 ENCOUNTER — Ambulatory Visit (AMBULATORY_SURGERY_CENTER): Payer: 59 | Admitting: Gastroenterology

## 2016-12-07 VITALS — BP 105/65 | HR 86 | Temp 97.8°F | Resp 16 | Ht 65.0 in | Wt 125.0 lb

## 2016-12-07 DIAGNOSIS — Z86010 Personal history of colonic polyps: Secondary | ICD-10-CM | POA: Diagnosis not present

## 2016-12-07 DIAGNOSIS — D120 Benign neoplasm of cecum: Secondary | ICD-10-CM | POA: Diagnosis not present

## 2016-12-07 DIAGNOSIS — D122 Benign neoplasm of ascending colon: Secondary | ICD-10-CM

## 2016-12-07 DIAGNOSIS — D123 Benign neoplasm of transverse colon: Secondary | ICD-10-CM

## 2016-12-07 MED ORDER — SODIUM CHLORIDE 0.9 % IV SOLN: 500.0000 mL | INTRAVENOUS | Status: DC

## 2016-12-07 NOTE — Progress Notes (Signed)
Called to room to assist during endoscopic procedure.  Patient ID and intended procedure confirmed with present staff. Received instructions for my participation in the procedure from the performing physician.  

## 2016-12-07 NOTE — Progress Notes (Signed)
Report given to PACU RN, vss 

## 2016-12-07 NOTE — Op Note (Signed)
[facility] Patient Name: Kimberly Collier Procedure Date: 12/07/2016 1:16 PM MRN: [id number] Endoscopist: Remo Lipps P. Omunique Pederson MD, MD Age: 60 Referring MD:  Date of Birth: Nov 06, 1956 Gender: Female Account #: [id number] Procedure:                Colonoscopy Indications:              Surveillance: Personal history of adenomatous                            polyps on last colonoscopy 5 years ago Medicines:                Monitored Anesthesia Care Procedure:                Pre-Anesthesia Assessment:                           - Prior to the procedure, a History and Physical                            was performed, and patient medications and                            allergies were reviewed. The patient's tolerance of                            previous anesthesia was also reviewed. The risks                            and benefits of the procedure and the sedation                            options and risks were discussed with the patient.                            All questions were answered, and informed consent                            was obtained. Prior Anticoagulants: The patient has                            taken no previous anticoagulant or antiplatelet                            agents. ASA Grade Assessment: II - A patient with                            mild systemic disease. After reviewing the risks                            and benefits, the patient was deemed in                            satisfactory condition to undergo the procedure.  After obtaining informed consent, the colonoscope                            was passed under direct vision. Throughout the                            procedure, the patient's blood pressure, pulse, and                            oxygen saturations were monitored continuously. The                            Model PCF-[id number]) scope was introduced                            through the  anus and advanced to the the cecum,                            identified by appendiceal orifice and ileocecal                            valve. The colonoscopy was performed without                            difficulty. The patient tolerated the procedure                            well. The quality of the bowel preparation was                            good. The ileocecal valve, appendiceal orifice, and                            rectum were photographed. Scope In: 1:32:26 PM Scope Out: 1:54:27 PM Scope Withdrawal Time: 0 hours 18 minutes 31 seconds  Total Procedure Duration: 0 hours 22 minutes 1 second  Findings:                 The perianal and digital rectal examinations were                            normal.                           A 7 mm polyp was found in the cecum. The polyp was                            flat. The polyp was removed with a cold snare.                            Resection and retrieval were complete.                           A 8 mm polyp was found in the ileocecal valve. The  polyp was flat. The polyp was removed with a cold                            snare. Resection and retrieval were complete.                           A 12 mm polyp was found in the ascending colon. The                            polyp was sessile. The polyp was removed with a                            cold snare. Resection and retrieval were complete.                           A 8 mm polyp was found in the transverse colon. The                            polyp was flat. The polyp was removed with a cold                            snare. Resection and retrieval were complete.                           Scattered medium-mouthed diverticula were found in                            the left colon and right colon.                           Anal papilla(e) were hypertrophied.                           The exam was otherwise without abnormality. Complications:             No immediate complications. Estimated blood loss:                            Minimal. Estimated Blood Loss:     Estimated blood loss was minimal. Impression:               - One 7 mm polyp in the cecum, removed with a cold                            snare. Resected and retrieved.                           - One 8 mm polyp at the ileocecal valve, removed                            with a cold snare. Resected and retrieved.                           - One 12  mm polyp in the ascending colon, removed                            with a cold snare. Resected and retrieved.                           - One 8 mm polyp in the transverse colon, removed                            with a cold snare. Resected and retrieved.                           - Diverticulosis in the left colon and in the right                            colon.                           - Anal papilla(e) were hypertrophied.                           - The examination was otherwise normal. Recommendation:           - Patient has a contact number available for                            emergencies. The signs and symptoms of potential                            delayed complications were discussed with the                            patient. Return to normal activities tomorrow.                            Written discharge instructions were provided to the                            patient.                           - Resume previous diet.                           - Continue present medications.                           - No ibuprofen, naproxen, or other non-steroidal                            anti-inflammatory drugs for 2 weeks after polyp                            removal.                           -  Await pathology results.                           - Repeat colonoscopy is recommended for                            surveillance. The colonoscopy date will be                            determined after pathology results  from today's                            exam become available for review. Remo Lipps P. Tzivia Oneil MD, MD 12/07/2016 1:59:33 PM This report has been signed electronically.

## 2016-12-07 NOTE — Patient Instructions (Signed)
NO NSAIDS (ADVIL, ALEVE, MOTRIN, IBUPROFEN, NAPROSYN, ETC. )FOR TWO WEEKS, December 22, 2023.   YOU HAD AN ENDOSCOPIC PROCEDURE TODAY AT [facility]:   Refer to the procedure report that was given to you for any specific questions about what was found during the examination.  If the procedure report does not answer your questions, please call your gastroenterologist to clarify.  If you requested that your care partner not be given the details of your procedure findings, then the procedure report has been included in a sealed envelope for you to review at your convenience later.  YOU SHOULD EXPECT: Some feelings of bloating in the abdomen. Passage of more gas than usual.  Walking can help get rid of the air that was put into your GI tract during the procedure and reduce the bloating. If you had a lower endoscopy (such as a colonoscopy or flexible sigmoidoscopy) you may notice spotting of blood in your stool or on the toilet paper. If you underwent a bowel prep for your procedure, you may not have a normal bowel movement for a few days.  Please Note:  You might notice some irritation and congestion in your nose or some drainage.  This is from the oxygen used during your procedure.  There is no need for concern and it should clear up in a day or so.  SYMPTOMS TO REPORT IMMEDIATELY:   Following lower endoscopy (colonoscopy or flexible sigmoidoscopy):  Excessive amounts of blood in the stool  Significant tenderness or worsening of abdominal pains  Swelling of the abdomen that is new, acute  Fever of 100F or higher    For urgent or emergent issues, a gastroenterologist can be reached at any hour by calling [phone].   DIET:  We do recommend a small meal at first, but then you may proceed to your regular diet.  Drink plenty of fluids but you should avoid alcoholic beverages for 24 hours.  ACTIVITY:  You should plan to take it easy for the rest of today and you should NOT  DRIVE or use heavy machinery until tomorrow (because of the sedation medicines used during the test).    FOLLOW UP: Our staff will call the number listed on your records the next business day following your procedure to check on you and address any questions or concerns that you may have regarding the information given to you following your procedure. If we do not reach you, we will leave a message.  However, if you are feeling well and you are not experiencing any problems, there is no need to return our call.  We will assume that you have returned to your regular daily activities without incident.  If any biopsies were taken you will be contacted by phone or by letter within the next 1-3 weeks.  Please call us at [phone] if you have not heard about the biopsies in 3 weeks.    SIGNATURES/CONFIDENTIALITY: You and/or your care partner have signed paperwork which will be entered into your electronic medical record.  These signatures attest to the fact that that the information above on your After Visit Summary has been reviewed and is understood.  Full responsibility of the confidentiality of this discharge information lies with you and/or your care-partner.

## 2016-12-08 ENCOUNTER — Telehealth: Payer: Self-pay

## 2016-12-08 NOTE — Telephone Encounter (Signed)
  Follow up Call-  Call back number 12/07/2016  Post procedure Call Back phone  # [phone]  Permission to leave phone message Yes  Some recent data might be hidden    Patient was called for follow up after her procedure on 12/07/2016. No answer at the number given for follow up phone call. A message was left on the answering machine.

## 2016-12-08 NOTE — Telephone Encounter (Signed)
  Follow up Call-  Call back number 12/07/2016  Post procedure Call Back phone  # [phone]  Permission to leave phone message Yes  Some recent data might be hidden    Patient was called for follow up after her procedure on 12/07/2016. No answer at the number given for follow up phone call. A message was left on the answering machine.

## 2016-12-09 ENCOUNTER — Telehealth: Payer: Self-pay

## 2016-12-09 NOTE — Telephone Encounter (Signed)
  Follow up Call-  Call back number 12/07/2016  Post procedure Call Back phone  # [phone]  Permission to leave phone message Yes  Some recent data might be hidden     Patient questions:  Do you have a fever, pain , or abdominal swelling? No. Pain Score  0 *  Have you tolerated food without any problems? Yes.    Have you been able to return to your normal activities? Yes.    Do you have any questions about your discharge instructions: Diet   No. Medications  No. Follow up visit  No.  Do you have questions or concerns about your Care? No.  Actions: * If pain score is 4 or above: No action needed, pain <4.

## 2016-12-14 ENCOUNTER — Encounter: Payer: Self-pay | Admitting: Gastroenterology

## 2018-08-22 ENCOUNTER — Other Ambulatory Visit: Payer: Self-pay

## 2018-08-22 ENCOUNTER — Emergency Department (HOSPITAL_COMMUNITY)
Admission: EM | Admit: 2018-08-22 | Discharge: 2018-08-23 | Disposition: A | Discharge status: Home / Self Care | Payer: 59 | Attending: Emergency Medicine | Admitting: Emergency Medicine

## 2018-08-22 ENCOUNTER — Emergency Department (HOSPITAL_COMMUNITY): Payer: 59

## 2018-08-22 ENCOUNTER — Encounter (HOSPITAL_COMMUNITY): Payer: Self-pay | Admitting: Emergency Medicine

## 2018-08-22 DIAGNOSIS — Y9341 Activity, dancing: Secondary | ICD-10-CM | POA: Insufficient documentation

## 2018-08-22 DIAGNOSIS — S0990XA Unspecified injury of head, initial encounter: Secondary | ICD-10-CM

## 2018-08-22 DIAGNOSIS — Z79899 Other long term (current) drug therapy: Secondary | ICD-10-CM | POA: Diagnosis not present

## 2018-08-22 DIAGNOSIS — S0083XA Contusion of other part of head, initial encounter: Secondary | ICD-10-CM | POA: Insufficient documentation

## 2018-08-22 DIAGNOSIS — Y999 Unspecified external cause status: Secondary | ICD-10-CM | POA: Insufficient documentation

## 2018-08-22 DIAGNOSIS — I10 Essential (primary) hypertension: Secondary | ICD-10-CM | POA: Diagnosis not present

## 2018-08-22 DIAGNOSIS — W0110XA Fall on same level from slipping, tripping and stumbling with subsequent striking against unspecified object, initial encounter: Secondary | ICD-10-CM | POA: Diagnosis not present

## 2018-08-22 DIAGNOSIS — S62101A Fracture of unspecified carpal bone, right wrist, initial encounter for closed fracture: Secondary | ICD-10-CM | POA: Insufficient documentation

## 2018-08-22 DIAGNOSIS — S6991XA Unspecified injury of right wrist, hand and finger(s), initial encounter: Secondary | ICD-10-CM | POA: Diagnosis present

## 2018-08-22 DIAGNOSIS — Y929 Unspecified place or not applicable: Secondary | ICD-10-CM | POA: Insufficient documentation

## 2018-08-22 DIAGNOSIS — T148XXA Other injury of unspecified body region, initial encounter: Secondary | ICD-10-CM

## 2018-08-22 MED ORDER — ACETAMINOPHEN ER 650 MG PO TBCR: 650.0000 mg | EXTENDED_RELEASE_TABLET | Freq: Three times a day (TID) | ORAL | 0 refills | Status: AC | PRN

## 2018-08-22 NOTE — ED Notes (Signed)
Bed: [facility] Expected date:  Expected time:  Means of arrival:  Comments: 

## 2018-08-22 NOTE — Discharge Instructions (Addendum)
You have fractures of the bone that helped from the wrist. Please read the instructions provided. Follow-up with orthopedic hand surgeon in 7 to 10 days.  We have not ordered a CT scan of the head, because you have not had any concerning symptoms following the injury. We recommend that you are checked on sometime in the middle the night to ensure you are doing okay.  Return to the ER if you start having new headaches, vomiting, confusion, seizure-like activity.

## 2018-08-22 NOTE — ED Notes (Signed)
Called ortho tech @[phone]  for Dr Kathrynn Humble.

## 2018-08-22 NOTE — ED Triage Notes (Signed)
Pt reports being in dance class and fell landing on right wrist and hitting head on hardwood floor. Injury occurred around 1900. Pulses, movement, and sensation noted to right wrist.

## 2018-08-23 NOTE — ED Provider Notes (Signed)
[facility] DEPT Provider Note   CSN: [id number] Arrival date & time: 08/22/18  1945     History   Chief Complaint Chief Complaint  Patient presents with  . Fall  . Wrist Injury    HPI Kimberly Collier is a 62 y.o. female.  HPI 62 year old female comes in with chief complaint of fall.  Patient has history of hypertension and hyperlipidemia.  She reports that she was practicing her dance routine when she fell and landed on her right wrist.  Patient also struck the back of her head on the hardwood floor.  The incident occurred around 7 PM.  Patient denies any headache, nausea, vomiting, seizures, loss of consciousness or new visual complains, weakness, numbness, dizziness or gait instability.  Patient is not on any blood thinners nor is she taking any aspirin or Plavix.  Patient's main pain is over her right wrist.  She is right-handed.  She has a little bit of numbness type feeling in her digits.   Past Medical History:  Diagnosis Date  . Abnormal Pap smear of cervix 11-2014   ASCUS  . Adenomatous colon polyp   . Allergy    pollen in spring  . GERD (gastroesophageal reflux disease)    mild but no medicines   . Hyperlipidemia    no current issue   . Hypertension     Patient Active Problem List   Diagnosis Date Noted  . Pain of right upper arm 10/09/2015  . ALLERGIC RHINITIS 10/29/2007  . EOSINOPHILIC ESOPHAGITIS 40/98/1191    Past Surgical History:  Procedure Laterality Date  . ABDOMINOPLASTY    . BREAST ENHANCEMENT SURGERY    . COLONOSCOPY    . COSMETIC SURGERY    . CRYOTHERAPY  2001  . FACIAL COSMETIC SURGERY    . POLYPECTOMY    . tummy    . UPPER GASTROINTESTINAL ENDOSCOPY       OB History    Gravida  1   Para  1   Term  1   Preterm      AB      Living  0     SAB      TAB      Ectopic      Multiple      Live Births               Home Medications    Prior to Admission medications   Medication  Sig Start Date End Date Taking? Authorizing Provider  acetaminophen (TYLENOL 8 HOUR) 650 MG CR tablet Take 1 tablet (650 mg total) by mouth every 8 (eight) hours as needed. 08/22/18   Varney Biles, MD  albuterol (PROVENTIL HFA;VENTOLIN HFA) 108 (90 Base) MCG/ACT inhaler Inhale into the lungs. 11/28/12   [provider]  b complex vitamins tablet Take 1 tablet by mouth daily.      [provider]  Calcium Carb-Cholecalciferol (CALCIUM 1000 + D) 1000-800 MG-UNIT TABS Take by mouth daily. Natures made    [provider]  Cholecalciferol (VITAMIN D) 2000 UNITS tablet Take 2,000 Units by mouth daily.      [provider]  Clocortolone Pivalate (CLODERM) 0.1 % cream continuously as needed. For eczema 10/08/14   [provider]  EPINEPHrine (EPI-PEN) 0.3 mg/0.3 mL SOAJ injection Inject 0.3 mLs (0.3 mg total) into the muscle once. 04/21/14   Lacretia Leigh, MD  fish oil-omega-3 fatty acids 1000 MG capsule Take 1 g by mouth daily.  [provider]  losartan-hydrochlorothiazide (HYZAAR) 50-12.5 MG per tablet Take 1 tablet by mouth Daily. 07/16/11   [provider]  MILK THISTLE PO Take by mouth.    [provider]  Multiple Vitamins-Minerals (MULTIVITAMIN WITH MINERALS) tablet Take 1 tablet by mouth daily.      [provider]  Plant Sterols and Stanols 450 MG TABS Take by mouth.    [provider]  tretinoin (RETIN-A) 0.025 % cream apply to affected area as directed 01/10/16   [provider]  VITAMIN A PO Take 1 tablet by mouth daily.      [provider]  vitamin B-12 (CYANOCOBALAMIN) 500 MCG tablet Take 500 mcg by mouth daily.      [provider]  vitamin C (ASCORBIC ACID) 500 MG tablet Take 500 mg by mouth daily.      [provider]  vitamin E (VITAMIN E) 400 UNIT capsule Take 400 Units by mouth daily.      [provider]  vitamin k 100 MCG tablet Take 100 mcg by  mouth daily.      [provider]    Family History Family History  Problem Relation Age of Onset  . Hyperlipidemia Father   . Hypertension Father   . Alzheimer's disease Father   . Colon polyps Father   . Thyroid disease Mother   . Colon cancer Neg Hx   . Rectal cancer Neg Hx   . Stomach cancer Neg Hx     Social History Social History   Tobacco Use  . Smoking status: Never Smoker  . Smokeless tobacco: Never Used  Substance Use Topics  . Alcohol use: Yes    Alcohol/week: 14.0 standard drinks    Types: 14 Glasses of wine per week  . Drug use: No     Allergies   Celebrex [celecoxib]   Review of Systems Review of Systems  Constitutional: Positive for activity change.  Musculoskeletal: Positive for joint swelling. Negative for back pain.  Skin: Positive for wound.  Hematological: Does not bruise/bleed easily.     Physical Exam Updated Vital Signs BP (!) 164/104 (BP Location: Left Arm)   Pulse 93   Temp 99.2 F (37.3 C) (Oral)   Resp 16   Ht 5\' 5"  (1.651 m)   Wt 61.2 kg   LMP 12/28/2005 (Approximate)   SpO2 96%   BMI 22.47 kg/m   Physical Exam  Constitutional: She is oriented to person, place, and time. She appears well-developed.  HENT:  Head: Normocephalic and atraumatic.  Patient has a hematoma over her occiput  Eyes: EOM are normal.  Neck: Normal range of motion. Neck supple.  Cardiovascular: Normal rate.  Pulmonary/Chest: Effort normal.  Abdominal: Bowel sounds are normal.  Musculoskeletal: She exhibits edema and tenderness.  Patient has gross deformity both over the distal ulnar and distal radial region of her wrist.  Patient has subjective paresthesias.  She is able to abduct, adduct and flex her digits.  Neurological: She is alert and oriented to person, place, and time.  Skin: Skin is warm and dry.  Nursing note and vitals reviewed.    ED Treatments / Results  Labs (all labs ordered are listed, but only abnormal results are  displayed) Labs Reviewed - No data to display  EKG None  Radiology Dg Wrist Complete Right  Result Date: 08/22/2018 CLINICAL DATA:  62 y/o  F; fall with radial wrist pain. EXAM: RIGHT WRIST - COMPLETE 3+ VIEW COMPARISON:  None.  FINDINGS: Acute impacted comminuted fracture of the distal radius with probable intra-articular extension. Mild radial displacement. Acute mildly displaced fracture of the ulnar styloid. Normal radiocarpal articulation. IMPRESSION: 1. Acute impacted comminuted fracture of the distal radius with mild radial displacement. Probable intra-articular extension. 2. Acute mildly displaced fracture ulnar styloid. Electronically Signed   By: Kristine Garbe M.D.   On: 08/22/2018 22:40    Procedures Procedures (including critical care time)  Medications Ordered in ED Medications - No data to display   Initial Impression / Assessment and Plan / ED Course  I have reviewed the triage vital signs and the nursing notes.  Pertinent labs & imaging results that were available during my care of the patient were reviewed by me and considered in my medical decision making (see chart for details).     62 year old female comes in with chief complaint of fall.   Patient had a Gardner type injury.  As a result of her fall she also injured her head.  No red flags on history or exam to suggest elevated intracranial pressure.  Patient's injury occurred at 40, by the time I assessed her it was 11:15 -and the patient is comfortable without getting a CT head.  We have discussed with the patient and her family member strict ER return precautions and the family member will check on the patient in the middle the night.  Additionally, patient also has what appears to be Colles' fracture of her right wrist.  She has some subjective paresthesias.  It does not seem like I would be able to get significant resolved by attempting reduction.  We have put the patient in the sugar tong splint.     Final Clinical Impressions(s) / ED Diagnoses   Final diagnoses:  Closed fracture of right wrist, initial encounter  Hematoma  Injury of head, initial encounter    ED Discharge Orders         Ordered    acetaminophen (TYLENOL 8 HOUR) 650 MG CR tablet  Every 8 hours PRN     08/22/18 2335           Varney Biles, MD 08/23/18 0017

## 2019-05-24 IMAGING — CR DG WRIST COMPLETE 3+V*R*
4 series · 4 of 4 positions shown · non-contrast
Comparison: None.

CLINICAL DATA: 62 y/o  F; fall with radial wrist pain.

EXAM:
RIGHT WRIST - COMPLETE 3+ VIEW

[x wrist pa right]
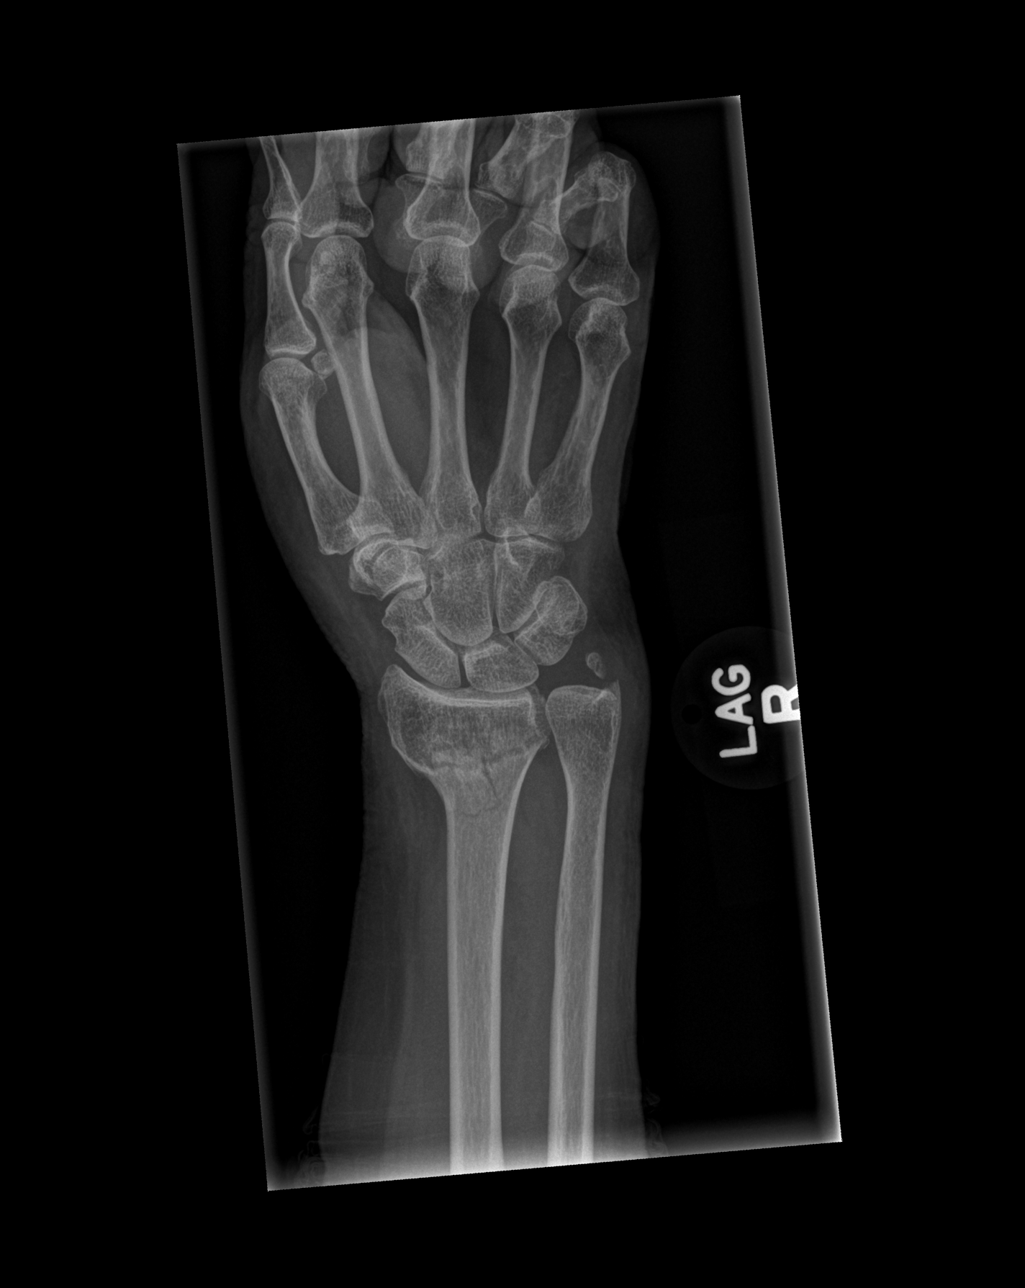

[x wrist obl right]
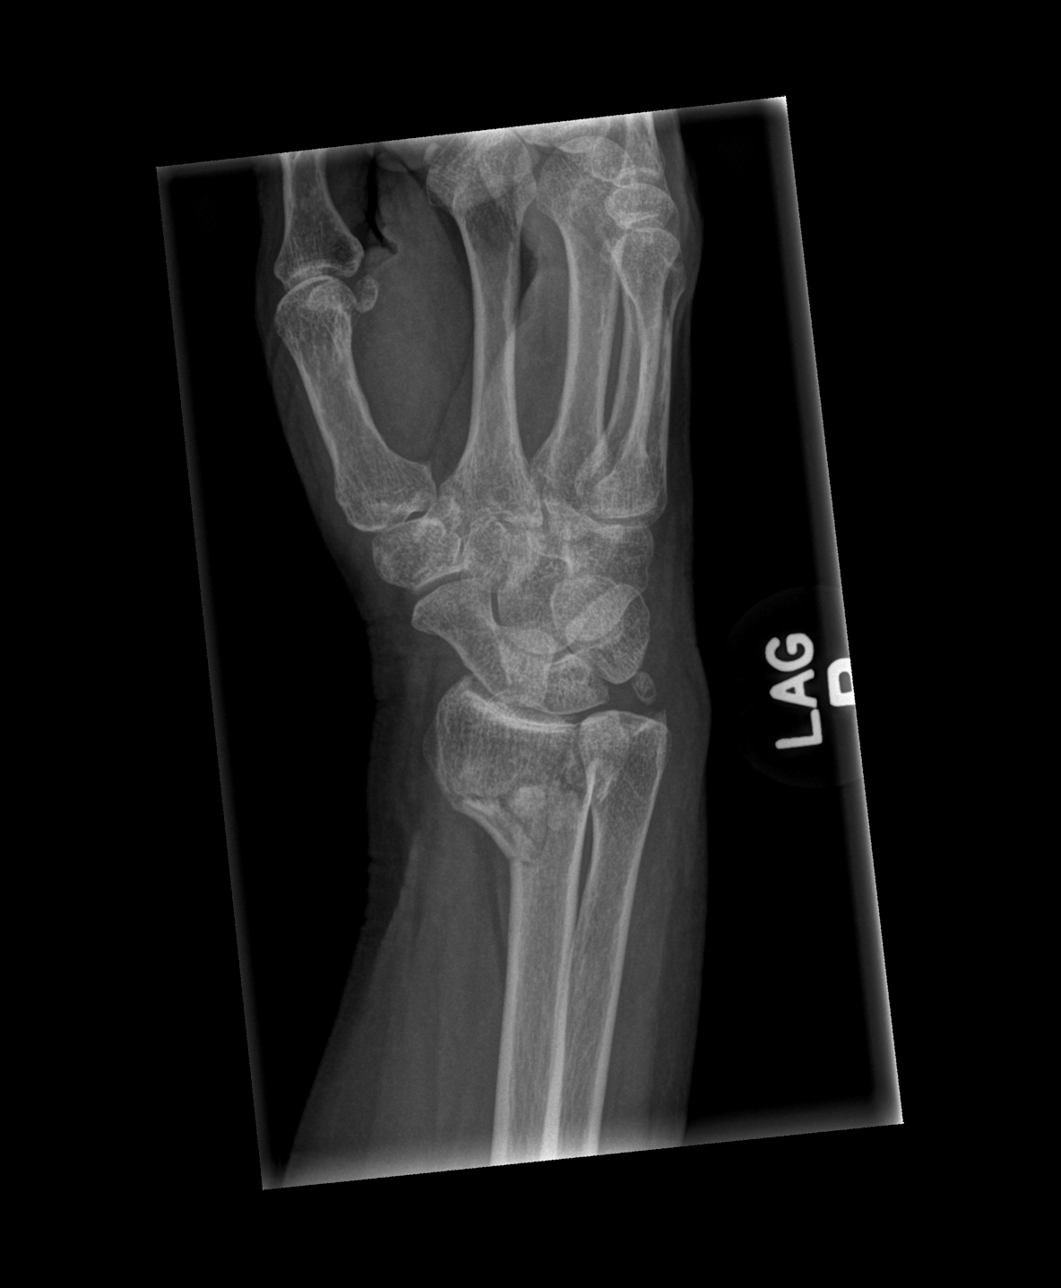

[x wrist lat right]
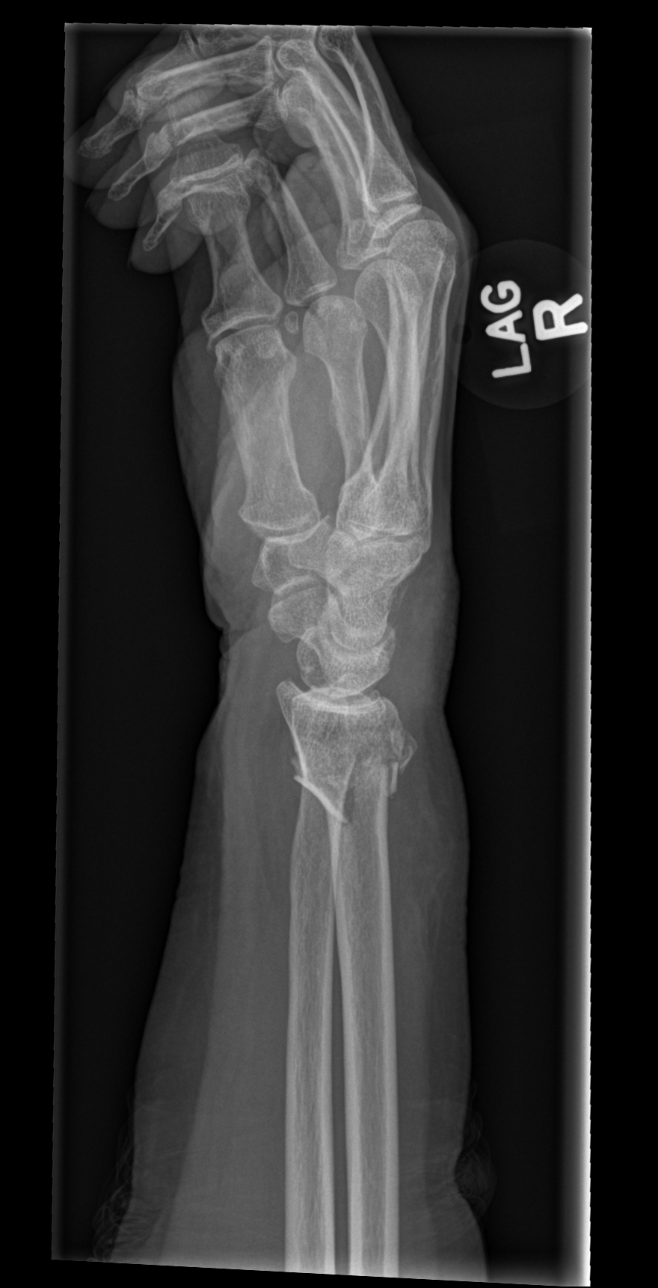

[x wrist navicular view right]
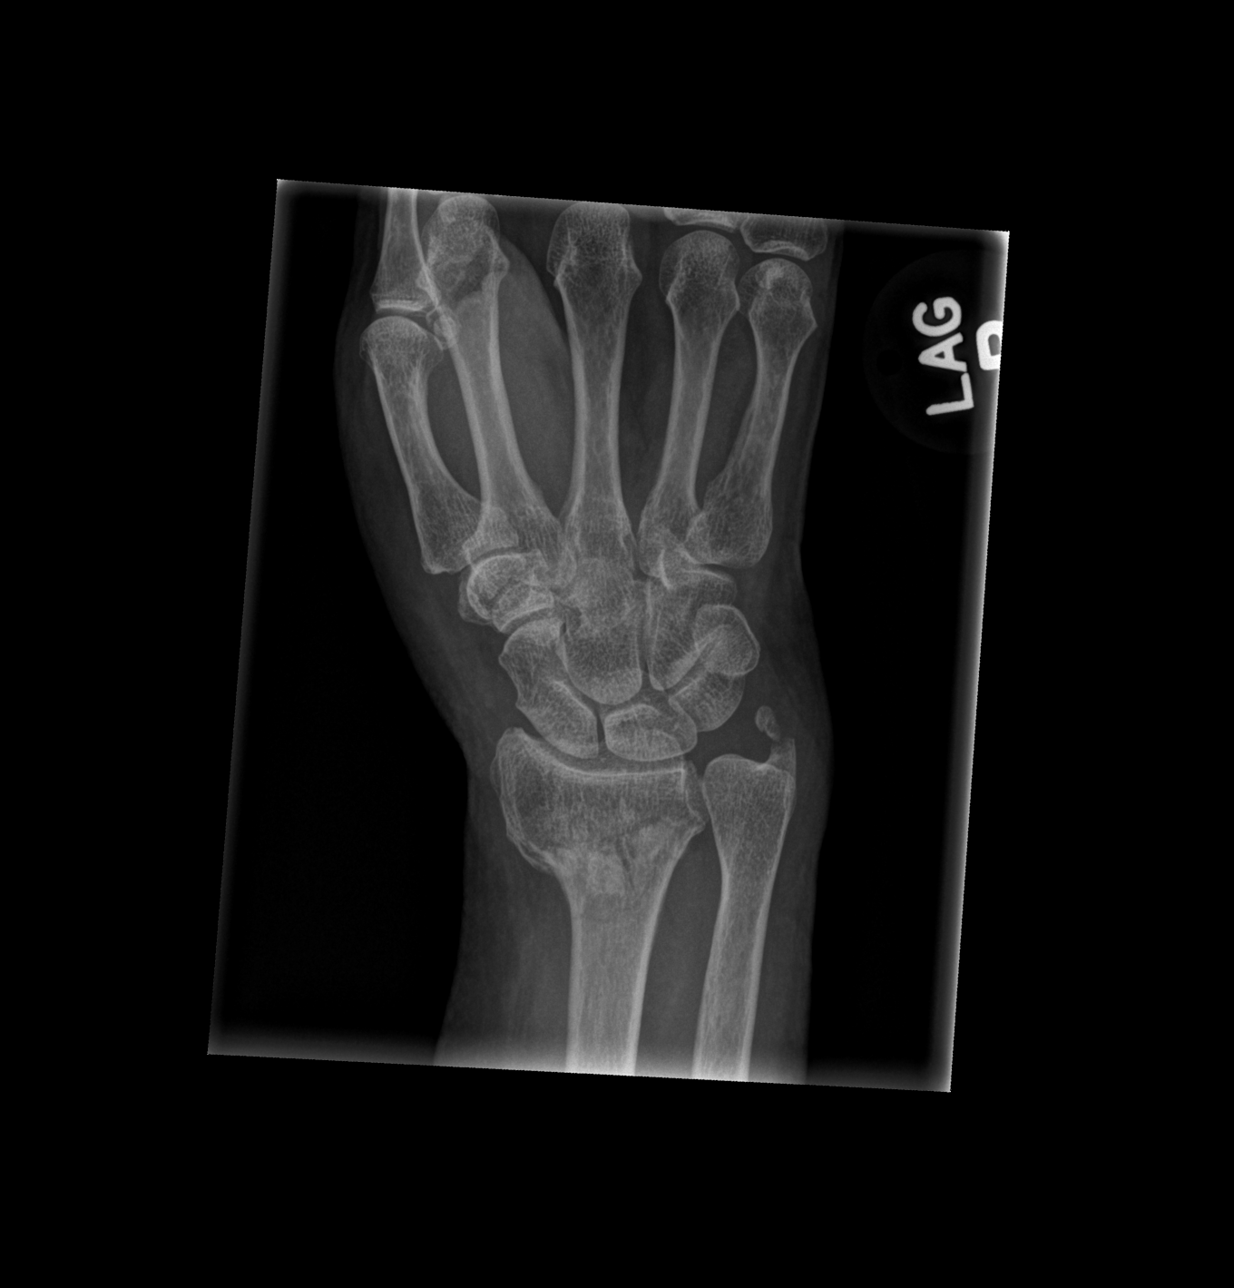

[4 of 4 positions shown; findings below may reference images not displayed]

FINDINGS: Acute impacted comminuted fracture of the distal radius with
probable intra-articular extension. Mild radial displacement. Acute
mildly displaced fracture of the ulnar styloid. Normal radiocarpal
articulation.
IMPRESSION: 1. Acute impacted comminuted fracture of the distal radius with mild
radial displacement. Probable intra-articular extension.
2. Acute mildly displaced fracture ulnar styloid.

By: Danute Ir Leonas Fasij M.D.

## 2019-11-27 ENCOUNTER — Encounter: Payer: Self-pay | Admitting: Gastroenterology

## 2019-12-29 HISTORY — PX: COLONOSCOPY: SHX174

## 2020-01-23 ENCOUNTER — Ambulatory Visit: Payer: 59

## 2020-02-01 ENCOUNTER — Ambulatory Visit: Payer: 59 | Attending: Internal Medicine

## 2020-02-01 DIAGNOSIS — Z23 Encounter for immunization: Secondary | ICD-10-CM | POA: Insufficient documentation

## 2020-02-01 NOTE — Progress Notes (Signed)
   Covid-19 Vaccination Clinic  Name:  Kimberly Collier    MRN: [id number] DOB: 11/23/1956  02/01/2020  [name] was observed post Covid-19 immunization for 15 minutes without incidence. She was provided with Vaccine Information Sheet and instruction to access the V-Safe system.   Ms. Neighbors was instructed to call 911 with any severe reactions post vaccine: Marland Kitchen Difficulty breathing  . Swelling of your face and throat  . A fast heartbeat  . A bad rash all over your body  . Dizziness and weakness    Immunizations Administered    Name Date Dose VIS Date Route   Pfizer COVID-19 Vaccine 02/01/2020  9:10 AM 0.3 mL 12/08/2019 Intramuscular   Manufacturer: [place]   Lot: [id number]   [place]: [id number]

## 2020-02-09 ENCOUNTER — Ambulatory Visit: Payer: 59

## 2020-02-26 ENCOUNTER — Ambulatory Visit: Payer: 59 | Attending: Internal Medicine

## 2020-02-26 DIAGNOSIS — Z23 Encounter for immunization: Secondary | ICD-10-CM | POA: Insufficient documentation

## 2020-02-26 NOTE — Progress Notes (Signed)
   Covid-19 Vaccination Clinic  Name:  Elnita Ricotta    MRN: [id number] DOB: Jan 01, 1956  02/26/2020  Ms. Clotfelter was observed post Covid-19 immunization for 15 minutes without incidence. She was provided with Vaccine Information Sheet and instruction to access the V-Safe system.   Ms. Caspary was instructed to call 911 with any severe reactions post vaccine: Marland Kitchen Difficulty breathing  . Swelling of your face and throat  . A fast heartbeat  . A bad rash all over your body  . Dizziness and weakness    Immunizations Administered    Name Date Dose VIS Date Route   Pfizer COVID-19 Vaccine 02/26/2020  1:49 PM 0.3 mL 12/08/2019 Intramuscular   Manufacturer: [place]   Lot: [id number]   [place]: [id number]

## 2020-04-08 ENCOUNTER — Telehealth: Payer: Self-pay | Admitting: Gastroenterology

## 2020-04-08 ENCOUNTER — Encounter: Payer: Self-pay | Admitting: Gastroenterology

## 2020-04-08 NOTE — Telephone Encounter (Signed)
No change, this recommendations are in line with the current guidelines.  She is due now.

## 2020-04-08 NOTE — Telephone Encounter (Signed)
Left message to call back  

## 2020-04-08 NOTE — Telephone Encounter (Signed)
Pt calling about her recall date according with new guidelines. Recall tab indicates that she was due 11/2019 but she is wondering if recall date could change with new guidelines.

## 2020-05-17 ENCOUNTER — Ambulatory Visit (AMBULATORY_SURGERY_CENTER): Payer: Self-pay | Admitting: *Deleted

## 2020-05-17 ENCOUNTER — Telehealth: Payer: Self-pay | Admitting: *Deleted

## 2020-05-17 ENCOUNTER — Other Ambulatory Visit: Payer: Self-pay

## 2020-05-17 VITALS — Ht 64.0 in | Wt 147.0 lb

## 2020-05-17 DIAGNOSIS — Z86010 Personal history of colonic polyps: Secondary | ICD-10-CM

## 2020-05-17 NOTE — Telephone Encounter (Signed)
error 

## 2020-05-17 NOTE — Progress Notes (Signed)
02-26-2020 COMP COVID VACCINES   Pt states she does not want anyone who has not been vaccinated to participate in her care on her procedure day of 06-04-20  No egg or soy allergy known to patient  No issues with past sedation with any surgeries  or procedures, no intubation problems  No diet pills per patient No home 02 use per patient  No blood thinners per patient  Pt denies issues with constipation  No A fib or A flutter  EMMI video sent to pt's e mail   Due to the COVID-19 pandemic we are asking patients to follow these guidelines. Please only bring one care partner. Please be aware that your care partner may wait in the car in the parking lot or if they feel like they will be too hot to wait in the car, they may wait in the lobby on the 4th floor. All care partners are required to wear a mask the entire time (we do not have any that we can provide them), they need to practice social distancing, and we will do a Covid check for all patient's and care partners when you arrive. Also we will check their temperature and your temperature. If the care partner waits in their car they need to stay in the parking lot the entire time and we will call them on their cell phone when the patient is ready for discharge so they can bring the car to the front of the building. Also all patient's will need to wear a mask into building.

## 2020-05-20 ENCOUNTER — Telehealth: Payer: Self-pay | Admitting: Gastroenterology

## 2020-05-20 NOTE — Telephone Encounter (Signed)
Returned call to patient at a number for call back and was able to discuss concerns regarding unvaccinated staff for covid.  Explained to the patient that her request, for having only covid vaccinated staff care for her during her procedure, would be difficult to accommodate.  As a facility, we follow CDC recommendations and we also discuss any policy change with multiple levels of experts to make sound decisions regarding keeping our patients safe.  Acknowledged patients concerns but due to our facility does not require covid vaccines for employees, it would be difficult to provide care to her knowing she is asking no one unvaccinated come in contact with her.   Her procedure physician and our medical director were both made aware of her concerns and agreed we could not guarantee she would only be in contact with vaccinated personnel.  Patient asked about N95 masks for staff and if she could wear N95 during procedure.   We discussed not all cases require an N95 mask so some personnel in the room may wear an N95 while others would wear an intermediate mask.  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  Advised we follow current recommendations for providing safe care to our patients but understand if she still has concerns.   Concluded conversation with giving the patient time to consider whether or not she would like to cancel or delay her procedure or she could seek care with another facility and provider.  Patient states she would think it over and contact us if she wishes to cancel or delay. Nothing further needed.

## 2020-05-20 NOTE — Telephone Encounter (Signed)
Contacted patient to discuss her concerns regarding an upcoming procedure and her request for only covid vaccinated staff to engage in her care.  The patient did not wish to verify her birthdate over the phone.  States she would like to call me back. My phone does not accept outside calls, only interoffice.  Was explaining to patient I could move to another location and have her call in but either we were disconnected or the patient hung up.  Will attempt to call her again from another location.

## 2020-05-21 ENCOUNTER — Encounter: Payer: Self-pay | Admitting: Gastroenterology

## 2020-06-04 ENCOUNTER — Encounter: Payer: Self-pay | Admitting: Gastroenterology

## 2020-06-04 ENCOUNTER — Ambulatory Visit (AMBULATORY_SURGERY_CENTER): Payer: 59 | Admitting: Gastroenterology

## 2020-06-04 ENCOUNTER — Other Ambulatory Visit: Payer: Self-pay

## 2020-06-04 VITALS — BP 109/64 | HR 84 | Temp 98.2°F | Resp 19 | Ht 64.0 in | Wt 147.0 lb

## 2020-06-04 DIAGNOSIS — Z86010 Personal history of colonic polyps: Secondary | ICD-10-CM

## 2020-06-04 DIAGNOSIS — K635 Polyp of colon: Secondary | ICD-10-CM | POA: Diagnosis not present

## 2020-06-04 DIAGNOSIS — D123 Benign neoplasm of transverse colon: Secondary | ICD-10-CM | POA: Diagnosis not present

## 2020-06-04 DIAGNOSIS — D122 Benign neoplasm of ascending colon: Secondary | ICD-10-CM

## 2020-06-04 DIAGNOSIS — D128 Benign neoplasm of rectum: Secondary | ICD-10-CM

## 2020-06-04 DIAGNOSIS — D120 Benign neoplasm of cecum: Secondary | ICD-10-CM

## 2020-06-04 MED ORDER — SODIUM CHLORIDE 0.9 % IV SOLN: 500.0000 mL | Freq: Once | INTRAVENOUS | Status: DC

## 2020-06-04 NOTE — Patient Instructions (Signed)
Handout on polyps and diverticulosis given    YOU HAD AN ENDOSCOPIC PROCEDURE TODAY AT THE [facility]:   Refer to the procedure report that was given to you for any specific questions about what was found during the examination.  If the procedure report does not answer your questions, please call your gastroenterologist to clarify.  If you requested that your care partner not be given the details of your procedure findings, then the procedure report has been included in a sealed envelope for you to review at your convenience later.  YOU SHOULD EXPECT: Some feelings of bloating in the abdomen. Passage of more gas than usual.  Walking can help get rid of the air that was put into your GI tract during the procedure and reduce the bloating. If you had a lower endoscopy (such as a colonoscopy or flexible sigmoidoscopy) you may notice spotting of blood in your stool or on the toilet paper. If you underwent a bowel prep for your procedure, you may not have a normal bowel movement for a few days.  Please Note:  You might notice some irritation and congestion in your nose or some drainage.  This is from the oxygen used during your procedure.  There is no need for concern and it should clear up in a day or so.  SYMPTOMS TO REPORT IMMEDIATELY:   Following lower endoscopy (colonoscopy or flexible sigmoidoscopy):  Excessive amounts of blood in the stool  Significant tenderness or worsening of abdominal pains  Swelling of the abdomen that is new, acute  Fever of 100F or higher   For urgent or emergent issues, a gastroenterologist can be reached at any hour by calling [phone]. Do not use MyChart messaging for urgent concerns.    DIET:  We do recommend a small meal at first, but then you may proceed to your regular diet.  Drink plenty of fluids but you should avoid alcoholic beverages for 24 hours.  ACTIVITY:  You should plan to take it easy for the rest of today and you should NOT  DRIVE or use heavy machinery until tomorrow (because of the sedation medicines used during the test).    FOLLOW UP: Our staff will call the number listed on your records 48-72 hours following your procedure to check on you and address any questions or concerns that you may have regarding the information given to you following your procedure. If we do not reach you, we will leave a message.  We will attempt to reach you two times.  During this call, we will ask if you have developed any symptoms of COVID 19. If you develop any symptoms (ie: fever, flu-like symptoms, shortness of breath, cough etc.) before then, please call [phone].  If you test positive for Covid 19 in the 2 weeks post procedure, please call and report this information to us.    If any biopsies were taken you will be contacted by phone or by letter within the next 1-3 weeks.  Please call us at [phone] if you have not heard about the biopsies in 3 weeks.    SIGNATURES/CONFIDENTIALITY: You and/or your care partner have signed paperwork which will be entered into your electronic medical record.  These signatures attest to the fact that that the information above on your After Visit Summary has been reviewed and is understood.  Full responsibility of the confidentiality of this discharge information lies with you and/or your care-partner. 

## 2020-06-04 NOTE — Progress Notes (Signed)
Pt's states no medical or surgical changes since previsit or office visit. 

## 2020-06-04 NOTE — Progress Notes (Signed)
Pt Drowsy. VSS. To PACU, report to RN. No anesthetic complications noted.  

## 2020-06-04 NOTE — Op Note (Signed)
[facility] Patient Name: Kimberly Collier Procedure Date: 06/04/2020 1:33 PM MRN: [id number] Endoscopist: Remo Lipps P. Havery Moros , MD Age: 64 Referring MD:  Date of Birth: 01/08/1956 Gender: Female Account #: [id number] Procedure:                Colonoscopy Indications:              High risk colon cancer surveillance: Personal                            history of colonic polyps (4 sessile serrated                            polyps removed 11/2016) Medicines:                Monitored Anesthesia Care Procedure:                Pre-Anesthesia Assessment:                           - Prior to the procedure, a History and Physical                            was performed, and patient medications and                            allergies were reviewed. The patient's tolerance of                            previous anesthesia was also reviewed. The risks                            and benefits of the procedure and the sedation                            options and risks were discussed with the patient.                            All questions were answered, and informed consent                            was obtained. Prior Anticoagulants: The patient has                            taken no previous anticoagulant or antiplatelet                            agents. ASA Grade Assessment: II - A patient with                            mild systemic disease. After reviewing the risks                            and benefits, the patient was deemed in  satisfactory condition to undergo the procedure.                           After obtaining informed consent, the colonoscope                            was passed under direct vision. Throughout the                            procedure, the patient's blood pressure, pulse, and                            oxygen saturations were monitored continuously. The                            Colonoscope was introduced through  the anus and                            advanced to the the cecum, identified by                            appendiceal orifice and ileocecal valve. The                            colonoscopy was performed without difficulty. The                            patient tolerated the procedure well. The quality                            of the bowel preparation was good. The ileocecal                            valve, appendiceal orifice, and rectum were                            photographed. Scope In: 1:49:54 PM Scope Out: 2:16:55 PM Scope Withdrawal Time: 0 hours 20 minutes 48 seconds  Total Procedure Duration: 0 hours 27 minutes 1 second  Findings:                 The perianal and digital rectal examinations were                            normal.                           A diminutive polyp was found in the ileocecal                            valve. The polyp was sessile. The polyp was removed                            with a cold biopsy forceps. Resection and retrieval  were complete.                           A 4 mm polyp was found in the ascending colon. The                            polyp was flat. The polyp was removed with a cold                            snare. Resection and retrieval were complete.                           Two sessile polyps were found in the hepatic                            flexure. The polyps were 5 mm in size. These polyps                            were removed with a cold snare. Resection and                            retrieval were complete.                           A 4 mm polyp was found in the rectum. The polyp was                            sessile. The polyp was removed with a cold snare.                            Resection and retrieval were complete.                           Scattered medium-mouthed diverticula were found in                            the left and transverse colon.                            Internal hemorrhoids were found during retroflexion.                           The exam was otherwise without abnormality. Complications:            No immediate complications. Estimated blood loss:                            Minimal. Estimated Blood Loss:     Estimated blood loss was minimal. Impression:               - One diminutive polyp at the ileocecal valve,                            removed with a cold biopsy forceps. Resected and  retrieved.                           - One 4 mm polyp in the ascending colon, removed                            with a cold snare. Resected and retrieved.                           - Two 5 mm polyps at the hepatic flexure, removed                            with a cold snare. Resected and retrieved.                           - One 4 mm polyp in the rectum, removed with a cold                            snare. Resected and retrieved.                           - Mild diverticulosis in the left and transverse                            colon.                           - Internal hemorrhoids.                           - The examination was otherwise normal. Recommendation:           - Patient has a contact number available for                            emergencies. The signs and symptoms of potential                            delayed complications were discussed with the                            patient. Return to normal activities tomorrow.                            Written discharge instructions were provided to the                            patient.                           - Resume previous diet.                           - Continue present medications.                           -  Await pathology results. Remo Lipps P. Havery Moros, MD 06/04/2020 2:22:27 PM This report has been signed electronically.

## 2020-06-04 NOTE — Progress Notes (Signed)
Called to room to assist during endoscopic procedure.  Patient ID and intended procedure confirmed with present staff. Received instructions for my participation in the procedure from the performing physician.  

## 2020-06-06 ENCOUNTER — Telehealth: Payer: Self-pay | Admitting: *Deleted

## 2020-06-06 NOTE — Telephone Encounter (Signed)
1.Have you developed a fever since your procedure? Pt said she did not have time for these questions 2.   Have you had an respiratory symptoms (SOB or cough) since your procedure? See above  3.   Have you tested positive for COVID 19 since your procedure ? see above  4.   Have you had any family members/close contacts diagnosed with the COVID 19 since your procedure?  See above   If yes to any of these questions please route to Joylene John, RN and Erenest Rasher, RN Follow up Call-  Call back number 06/04/2020  Post procedure Call Back phone  # [phone]  Permission to leave phone message Yes  Some recent data might be hidden     Patient questions:  Do you have a fever, pain , or abdominal swelling? No. Pain Score  0 *  Have you tolerated food without any problems? Yes.    Have you been able to return to your normal activities? Yes.    Do you have any questions about your discharge instructions: Diet   No. Medications  No. Follow up visit  No.  Do you have questions or concerns about your Care? No.  Actions: * If pain score is 4 or above: No action needed, pain <4.

## 2021-06-13 ENCOUNTER — Ambulatory Visit: Payer: 59 | Attending: Physician Assistant | Admitting: Physical Therapy

## 2021-06-13 ENCOUNTER — Encounter: Payer: Self-pay | Admitting: Physical Therapy

## 2021-06-13 ENCOUNTER — Other Ambulatory Visit: Payer: Self-pay

## 2021-06-13 DIAGNOSIS — R293 Abnormal posture: Secondary | ICD-10-CM | POA: Diagnosis present

## 2021-06-13 DIAGNOSIS — M6281 Muscle weakness (generalized): Secondary | ICD-10-CM | POA: Diagnosis present

## 2021-06-13 NOTE — Patient Instructions (Signed)
Access Code: [email or web address] URL: [email or web address] Date: 06/13/2021 Prepared by: Ruben Im  Exercises Supine Bent Leg Lift with Knee Extension - 1 x daily - 7 x weekly - 1 sets - 10 reps Supine Dead Bug with Leg Extension - 1 x daily - 7 x weekly - 1 sets - 10 reps Bird Dog - 1 x daily - 7 x weekly - 1 sets - 10 reps Standing Hip Hinge with Dowel - 1 x daily - 7 x weekly - 1 sets - 10 reps Seated Hip Hinge with Dowel - 1 x daily - 7 x weekly - 1 sets - 10 reps Standing Bent Over Single Arm Shoulder Row - 1 x daily - 7 x weekly - 1 sets - 10 reps Bent Over Single Arm Shoulder Row with Dumbbell - 1 x daily - 7 x weekly - 1 sets - 10 reps

## 2021-06-13 NOTE — Therapy (Signed)
[facility] [address], [place], [place], [address] Phone: [phone]   Fax:  [phone]  Physical Therapy Evaluation  Patient Details  Name: Kimberly Collier MRN: [id number] Date of Birth: 1956-06-15 Referring Provider (PT): Fredrich Romans PA   Encounter Date: 06/13/2021   PT End of Session - 06/13/21 1504     Visit Number 1    Number of Visits 18    Date for PT Re-Evaluation 09/05/21    Authorization Type 23 visits (5 used at [facility])    PT Start Time 0803    PT Stop Time 0846    PT Time Calculation (min) 43 min    Activity Tolerance Patient tolerated treatment well             Past Medical History:  Diagnosis Date   Abnormal Pap smear of cervix 11-2014   ASCUS   Adenomatous colon polyp    Allergy    pollen in spring   GERD (gastroesophageal reflux disease)    mild but no medicines    Hyperlipidemia    no current issue    Hypertension     Past Surgical History:  Procedure Laterality Date   ABDOMINOPLASTY     BREAST ENHANCEMENT SURGERY     COLONOSCOPY     COSMETIC SURGERY     CRYOTHERAPY  2001   FACIAL COSMETIC SURGERY     POLYPECTOMY     tummy     UPPER GASTROINTESTINAL ENDOSCOPY     WISDOM TOOTH EXTRACTION     as teen with no sedation per pt     There were no vitals filed for this visit.    Subjective Assessment - 06/13/21 0805     Subjective New diagnosis of osteoporosis following bone density test in April.  Femurs were highest score.  Late Feb can dropped on foot.  Stress fracture 2nd metatarsal foot.  Saw old metatarsal fractures.   Wore boot and fell.  Small compression fracture L5.  Started on Fosamax.   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  Doing yoga 3-6 days per week.    Pertinent History right wrist fracture 2019    Diagnostic tests Bone density tests    Patient Stated Goals Reverse osteoporosis and stop it    Currently in Pain? No/denies                 [facility] PT Assessment - 06/13/21 0001       Assessment   Medical Diagnosis osteoporosis    Referring Provider (PT) Fredrich Romans PA    Onset Date/Surgical Date --   diagnosed in April   Prior Therapy had 5 visits of PT at [facility]      Precautions   Precautions Other (comment)    Precaution Comments osteoporosis precautions      Restrictions   Weight Bearing Restrictions No      Balance Screen   Has the patient fallen in the past 6 months No    Has the patient had a decrease in activity level because of a fear of falling?  No    Is the patient reluctant to leave their home because of a fear of falling?  No      Home Ecologist residence      Prior Function   Level of Independence Independent    Vocation Full time employment    Leisure yoga      Functional Tests  Functional tests --   instructed in hip hinge for safe lifting     Squat   Comments encouraged increased hip flexion/less knee flexon      AROM   Overall AROM Comments UE, spinal, LE WFLs nonpainful      Strength   Overall Strength Comments grossly WFLs; decreased activation of transverse abdominus, lumbar multifidi, scapular retractors/depressors; glute med/max 4/5                        Objective measurements completed on examination: See above findings.       [facility] PT Treatment/Exercise - 06/13/21 0001       Lumbar Exercises: Standing   Other Standing Lumbar Exercises hip hinge with golf club 3 points of contact    Other Standing Lumbar Exercises osteoporosis precautions relating to yoga; principles of building bone density      Lumbar Exercises: Supine   Ab Set 5 reps    Bent Knee Raise 5 reps    Dead Bug 5 reps      Knee/Hip Exercises: Seated   Sit to Sand --   with golf club 3 points of contact                   PT Education - 06/13/21 1503     Education Details supine ab series; bird dogs, hip hinge; bent rows     Person(s) Educated Patient    Methods Explanation;Demonstration;Handout    Comprehension Returned demonstration;Verbalized understanding              PT Short Term Goals - 06/13/21 1528       PT SHORT TERM GOAL #1   Title The patient will express good understanding of osteoporosis precautions/safe movements    Time 6    Period Weeks    Status New    Target Date 07/25/21      PT SHORT TERM GOAL #2   Title The patient will demonstrate safe body mechanics/hip hinge with bending and lifting    Time 6    Period Weeks    Status New               PT Long Term Goals - 06/13/21 1529       PT LONG TERM GOAL #1   Title The patient will be independent in a safe self progression of ex program that will improve bone density, decrease risk of fracture and maintain postural alignment    Time 12    Period Weeks    Status New    Target Date 09/05/21      PT LONG TERM GOAL #2   Title The patient will have 4+/5 to 5/5 strength of scapular and lumbo/pelvic/hip musculature    Time 12    Period Weeks    Status New      PT LONG TERM GOAL #3   Title The patient will demonstrate good body mechanics with lifting medium weights needed for bone density and muscular strength    Time 12    Period Weeks    Status New                    Plan - 06/13/21 1509     Clinical Impression Statement The patient diagnosed with osteoporosis in April.  She is referred to PT with a goal of halting and reversal of bone density loss.  She reports a history of right wrist fracture in 2019,  metatarsal fractures and L5 compression fracture.  She has discontinued ballroom dancing since the onset of the pandemic but regularly does yoga 3-6x/week.  Generally UEs , spinal and LE ROM WFLs. Strength is generally good but would benefit from improved activation of transverse abdominus muscles, lumbar multifidi, gluteals and scapular retractors/depressors 4/5.  She would benefit from strengthening to  prevent the postural deformities associated with osteoporosis.   Discussed osteoporosis precautions of movement; body mechanics (particularly hip hinging) and types of exercise that are most beneficial.    Examination-Activity Limitations Lift;Squat    Examination-Participation Restrictions Community Activity;Cleaning;Occupation    Stability/Clinical Decision Making Stable/Uncomplicated    Clinical Decision Making Low    Rehab Potential Good    PT Frequency 1x / week    PT Duration 12 weeks    PT Treatment/Interventions ADLs/Self Care Home Management;Neuromuscular re-education;Therapeutic exercise;Therapeutic activities;Functional mobility training;Patient/family education;Manual techniques    PT Next Visit Plan review hip hinge and review precautions; progress HEP: prone multifidi activation; prone head lift/arm lifts; wall ex's; resistive ex's and weight bearing (note hx of wrist fx)   lat strengthening    PT Home Exercise Plan [email or web address]    Recommended Other Services looking for a nutritionist    Consulted and Agree with Plan of Care Patient             Patient will benefit from skilled therapeutic intervention in order to improve the following deficits and impairments:  Decreased strength, Improper body mechanics, Decreased knowledge of precautions  Visit Diagnosis: Muscle weakness (generalized) - Plan: PT plan of care cert/re-cert  Abnormal posture - Plan: PT plan of care cert/re-cert     Problem List Patient Active Problem List   Diagnosis Date Noted   Pain of right upper arm 10/09/2015   ALLERGIC RHINITIS 70/12/7492   EOSINOPHILIC ESOPHAGITIS 49/67/5916   Ruben Im, PT 06/13/21 3:37 PM Phone: [phone] Fax: [phone]  Alvera Singh 06/13/2021, 3:35 PM  [facility] [address] [place], [place], [address] Phone: [phone]   Fax:  [phone]  Name: Sharesa Kemp MRN: [id number] Date  of Birth: Sep 08, 1956

## 2021-07-15 ENCOUNTER — Ambulatory Visit: Payer: 59 | Admitting: Physical Therapy

## 2022-11-27 HISTORY — PX: WRIST FRACTURE SURGERY: SHX121

## 2023-05-31 ENCOUNTER — Encounter: Payer: Self-pay | Admitting: Gastroenterology

## 2023-07-07 ENCOUNTER — Encounter: Payer: Self-pay | Admitting: Gastroenterology

## 2023-09-06 ENCOUNTER — Ambulatory Visit (AMBULATORY_SURGERY_CENTER): Payer: BC Managed Care – PPO

## 2023-09-06 VITALS — Ht 65.0 in | Wt 138.0 lb

## 2023-09-06 DIAGNOSIS — Z86010 Personal history of colonic polyps: Secondary | ICD-10-CM

## 2023-09-06 MED ORDER — NA SULFATE-K SULFATE-MG SULF 17.5-3.13-1.6 GM/177ML PO SOLN
1.0000 | Freq: Once | ORAL | 0 refills | Status: AC
Start: 2023-09-06 — End: 2023-09-06

## 2023-09-06 NOTE — Progress Notes (Signed)
Pre visit completed via phone call; Patient verified name, DOB, and address;  No egg or soy allergy known to patient;  Isues known to pt with past sedation with any surgeries or procedures--------------------patient reports on the last procedure she had done in 11/2022 she was given Diprivan and the "anesthesia team had a hard time keeping me sedated"- "they tried with propofol two times and then decided that I needed to have the procedure without the Diprovan"; no allergic reaction noted-per patient other than COUGHING; Patient denies ever being told they had issues or difficulty with intubation;  No FH of Malignant Hyperthermia; Pt is not on diet pills; Pt is not on home 02;  Pt is not on blood thinners;  Pt denies issues with constipation;  No A fib or A flutter; Have any cardiac testing pending--NO Insurance verified during PV appt--- BCBS Blue Options Pt can ambulate without assistance;  Pt denies use of chewing tobacco; Discussed diabetic/weight loss medication holds; Discussed NSAID holds; Checked BMI to be less than 50; Pt instructed to use Singlecare.com or GoodRx for a price reduction on prep;  Patient's chart reviewed by Cathlyn Parsons CNRA prior to previsit and patient appropriate for the [facility];  Pre visit completed and red dot placed by patient's name on their procedure day (on provider's schedule).    Instructions printed and sent to patient per her request as well as sent to MyChart;

## 2023-09-14 ENCOUNTER — Encounter: Payer: Self-pay | Admitting: Gastroenterology

## 2023-09-27 ENCOUNTER — Ambulatory Visit (AMBULATORY_SURGERY_CENTER): Payer: BC Managed Care – PPO | Admitting: Gastroenterology

## 2023-09-27 ENCOUNTER — Encounter: Payer: Self-pay | Admitting: Gastroenterology

## 2023-09-27 VITALS — BP 124/80 | HR 83 | Temp 98.0°F | Resp 19 | Ht 65.0 in | Wt 138.0 lb

## 2023-09-27 DIAGNOSIS — Z09 Encounter for follow-up examination after completed treatment for conditions other than malignant neoplasm: Secondary | ICD-10-CM

## 2023-09-27 DIAGNOSIS — Z860100 Personal history of colon polyps, unspecified: Secondary | ICD-10-CM

## 2023-09-27 DIAGNOSIS — D122 Benign neoplasm of ascending colon: Secondary | ICD-10-CM | POA: Diagnosis not present

## 2023-09-27 DIAGNOSIS — K635 Polyp of colon: Secondary | ICD-10-CM | POA: Diagnosis not present

## 2023-09-27 MED ORDER — SODIUM CHLORIDE 0.9 % IV SOLN: 500.0000 mL | Freq: Once | INTRAVENOUS | Status: DC

## 2023-09-27 NOTE — Progress Notes (Signed)
[facility] History and Physical   Primary Care Physician:  Marin Roberts, MD   Reason for Procedure:   History of colon polyps  Plan:    colonoscopy     HPI: Kimberly Collier is a 67 y.o. female  here for colonoscopy surveillance - last exam 05/2020 - 5 polyps removed.  . Patient denies any bowel symptoms at this time. No family history of colon cancer known. Otherwise feels well without any cardiopulmonary symptoms.   I have discussed risks / benefits of anesthesia and endoscopic procedure with Georgeanne Nim and they wish to proceed with the exams as outlined today.    Past Medical History:  Diagnosis Date   Abnormal Pap smear of cervix 11/2014   ASCUS   Adenomatous colon polyp    Allergy    pollen in spring   GERD (gastroesophageal reflux disease)    mild but no medicines    Hyperlipidemia    no current issue    Hypertension     Past Surgical History:  Procedure Laterality Date   ABDOMINOPLASTY     BREAST ENHANCEMENT SURGERY     COLONOSCOPY  2021   SA-sutab(good)-SSA/hems/tics   COSMETIC SURGERY     CRYOTHERAPY  2001   FACIAL COSMETIC SURGERY     POLYPECTOMY     tummy     UPPER GASTROINTESTINAL ENDOSCOPY     WISDOM TOOTH EXTRACTION     as teen with no sedation per pt    WRIST FRACTURE SURGERY Left 11/2022    Prior to Admission medications   Medication Sig Start Date End Date Taking? Authorizing Provider  Calcium-Phosphorus-Vitamin D (CITRACAL +D3 PO) Take 1 tablet by mouth daily at 6 (six) AM.   Yes [provider]  losartan (COZAAR) 50 MG tablet Take 50 mg by mouth daily.   Yes [provider]  rosuvastatin (CRESTOR) 5 MG tablet Take 5 mg by mouth daily. 07/01/23  Yes [provider]  triamterene-hydrochlorothiazide (DYAZIDE) 37.5-25 MG capsule Take 1 capsule by mouth daily.   Yes [provider]  TYMLOS 3120 MCG/1.56ML SOPN Inject 1 Dose into the skin daily at 6 (six) AM. osteoporosis   Yes [provider]  acetaminophen (TYLENOL 8 HOUR) 650 MG CR tablet Take 1 tablet (650 mg total) by mouth every 8 (eight) hours as needed. 08/22/18   Derwood Kaplan, MD  ASPIRIN 81 PO Take 1 tablet by mouth daily at 6 (six) AM.    [provider]  Cholecalciferol (VITAMIN D) 2000 UNITS tablet Take 2,000 Units by mouth daily.      [provider]  Clocortolone Pivalate (CLODERM) 0.1 % cream Apply 1 Application topically daily as needed (exzema). 10/08/14   [provider]  EPINEPHrine (EPI-PEN) 0.3 mg/0.3 mL SOAJ injection Inject 0.3 mLs (0.3 mg total) into the muscle once. 04/21/14   Lorre Nick, MD  ibuprofen (ADVIL) 600 MG tablet Take 1 tablet by mouth 3 (three) times daily.    [provider]  Magnesium 400 MG TABS Take 400 mg by mouth daily at 6 (six) AM.    [provider]  Multiple Vitamins-Minerals (MULTIVITAMIN WITH MINERALS) tablet Take 1 tablet by mouth daily.      [provider]  OZEMPIC, 2 MG/DOSE, 8 MG/3ML SOPN Inject 2 mg into the skin once a week. 08/25/23   [provider]  tretinoin (RETIN-A) 0.025 % cream apply to affected area as directed 01/10/16   [provider]    Current Outpatient  Medications  Medication Sig Dispense Refill   Calcium-Phosphorus-Vitamin D (CITRACAL +D3 PO) Take 1 tablet by mouth daily at 6 (six) AM.     losartan (COZAAR) 50 MG tablet Take 50 mg by mouth daily.     rosuvastatin (CRESTOR) 5 MG tablet Take 5 mg by mouth daily.     triamterene-hydrochlorothiazide (DYAZIDE) 37.5-25 MG capsule Take 1 capsule by mouth daily.     TYMLOS 3120 MCG/1.56ML SOPN Inject 1 Dose into the skin daily at 6 (six) AM. osteoporosis     acetaminophen (TYLENOL 8 HOUR) 650 MG CR tablet Take 1 tablet (650 mg total) by mouth every 8 (eight) hours as needed. 30 tablet 0   ASPIRIN 81 PO Take 1 tablet by mouth daily at 6 (six) AM.     Cholecalciferol (VITAMIN D) 2000 UNITS tablet Take 2,000 Units by mouth daily.        Clocortolone Pivalate (CLODERM) 0.1 % cream Apply 1 Application topically daily as needed (exzema).     EPINEPHrine (EPI-PEN) 0.3 mg/0.3 mL SOAJ injection Inject 0.3 mLs (0.3 mg total) into the muscle once. 1 Device 2   ibuprofen (ADVIL) 600 MG tablet Take 1 tablet by mouth 3 (three) times daily.     Magnesium 400 MG TABS Take 400 mg by mouth daily at 6 (six) AM.     Multiple Vitamins-Minerals (MULTIVITAMIN WITH MINERALS) tablet Take 1 tablet by mouth daily.       OZEMPIC, 2 MG/DOSE, 8 MG/3ML SOPN Inject 2 mg into the skin once a week.     tretinoin (RETIN-A) 0.025 % cream apply to affected area as directed     Current Facility-Administered Medications  Medication Dose Route Frequency Provider Last Rate Last Admin   0.9 %  sodium chloride infusion  500 mL Intravenous Once Daphane Odekirk, Willaim Rayas, MD        Allergies as of 09/27/2023 - Review Complete 09/27/2023  Allergen Reaction Noted   Celecoxib Rash 07/21/2011   Diprivan [propofol] Other (See Comments) and Cough 09/06/2023   Latex Itching, Dermatitis, and Rash 09/06/2023   Other Itching, Dermatitis, and Rash 09/06/2023    Family History  Problem Relation Age of Onset   Thyroid disease Mother    Hyperlipidemia Father    Hypertension Father    Alzheimer's disease Father    Colon polyps Father 28   Colon cancer Neg Hx    Rectal cancer Neg Hx    Stomach cancer Neg Hx    Esophageal cancer Neg Hx     Social History   Socioeconomic History   Marital status: Widowed    Spouse name: Not on file   Number of children: Not on file   Years of education: Not on file   Highest education level: Not on file  Occupational History   Not on file  Tobacco Use   Smoking status: Never   Smokeless tobacco: Never  Vaping Use   Vaping status: Never Used  Substance and Sexual Activity   Alcohol use: Yes    Alcohol/week: 14.0 standard drinks of alcohol    Types: 14 Glasses of wine per week   Drug use: No   Sexual activity: Yes     Partners: Male  Other Topics Concern   Not on file  Social History Narrative   Not on file   Social Determinants of Health   Financial Resource Strain: Not on file  Food Insecurity: Not on file  Transportation Needs: Not on file  Physical Activity: Not on  file  Stress: Not on file  Social Connections: Not on file  Intimate Partner Violence: Not on file    Review of Systems: All other review of systems negative except as mentioned in the HPI.  Physical Exam: Vital signs BP 134/89   Pulse (!) 109   Temp 98 F (36.7 C)   Ht 5\' 5"  (1.651 m)   Wt 138 lb (62.6 kg)   LMP 12/28/2005 (Approximate)   SpO2 96%   BMI 22.96 kg/m   General:   Alert,  Well-developed, pleasant and cooperative in NAD Lungs:  Clear throughout to auscultation.   Heart:  Regular rate and rhythm Abdomen:  Soft, nontender and nondistended.   Neuro/Psych:  Alert and cooperative. Normal mood and affect. A and O x 3  Harlin Rain, MD [facility]

## 2023-09-27 NOTE — Op Note (Signed)
[facility] Patient Name: Kimberly Collier Procedure Date: 09/27/2023 7:58 AM MRN: [id number] Endoscopist: Viviann Spare P. Adela Lank , MD, [id number] Age: 67 Referring MD:  Date of Birth: [birth year] Gender: Female Account #: [id number] Procedure:                Colonoscopy Indications:              High risk colon cancer surveillance: Personal                            history of colonic polyps - 11/2016 - 4 sessile                            serrated polyps, 05/2020 - 5 polyps Medicines:                Monitored Anesthesia Care Procedure:                Pre-Anesthesia Assessment:                           - Prior to the procedure, a History and Physical                            was performed, and patient medications and                            allergies were reviewed. The patient's tolerance of                            previous anesthesia was also reviewed. The risks                            and benefits of the procedure and the sedation                            options and risks were discussed with the patient.                            All questions were answered, and informed consent                            was obtained. Prior Anticoagulants: The patient has                            taken no anticoagulant or antiplatelet agents. ASA                            Grade Assessment: II - A patient with mild systemic                            disease. After reviewing the risks and benefits,                            the patient was deemed in satisfactory condition to  undergo the procedure.                           After obtaining informed consent, the colonoscope                            was passed under direct vision. Throughout the                            procedure, the patient's blood pressure, pulse, and                            oxygen saturations were monitored continuously. The                            Olympus Scope SN:  [id number] was introduced through                            the anus and advanced to the the cecum, identified                            by appendiceal orifice and ileocecal valve. The                            colonoscopy was performed without difficulty. The                            patient tolerated the procedure well. The quality                            of the bowel preparation was good. The ileocecal                            valve, appendiceal orifice, and rectum were                            photographed. Scope In: 8:01:10 AM Scope Out: 8:17:29 AM Scope Withdrawal Time: 0 hours 11 minutes 33 seconds  Total Procedure Duration: 0 hours 16 minutes 19 seconds  Findings:                 The perianal and digital rectal examinations were                            normal.                           A 4 to 5 mm polyp was found in the ascending colon.                            The polyp was flat. The polyp was removed with a                            cold snare. Resection and retrieval were complete.  Multiple small-mouthed diverticula were found in                            the left colon and right colon.                           The exam was otherwise without abnormality other                            than small internal hemorrhoids. Complications:            No immediate complications. Estimated blood loss:                            Minimal. Estimated Blood Loss:     Estimated blood loss was minimal. Impression:               - One 4 to 5 mm polyp in the ascending colon,                            removed with a cold snare. Resected and retrieved.                           - Diverticulosis in the left colon and in the right                            colon.                           - Small internal hemorrhoids.                           - The examination was otherwise normal. Recommendation:           - Patient has a contact number available for                             emergencies. The signs and symptoms of potential                            delayed complications were discussed with the                            patient. Return to normal activities tomorrow.                            Written discharge instructions were provided to the                            patient.                           - Resume previous diet.                           - Continue present medications.                           -  Await pathology results. Viviann Spare P. Adela Lank, MD 09/27/2023 8:23:38 AM This report has been signed electronically.

## 2023-09-27 NOTE — Progress Notes (Signed)
Vss nad trans to pacu 

## 2023-09-27 NOTE — Patient Instructions (Signed)

## 2023-09-27 NOTE — Progress Notes (Signed)
Called to room to assist during endoscopic procedure.  Patient ID and intended procedure confirmed with present staff. Received instructions for my participation in the procedure from the performing physician.  

## 2023-09-28 ENCOUNTER — Telehealth: Payer: Self-pay

## 2023-09-28 NOTE — Telephone Encounter (Signed)
  Follow up Call-     09/27/2023    7:10 AM  Call back number  Post procedure Call Back phone  # [phone]  Permission to leave phone message Yes     Patient questions:  Do you have a fever, pain , or abdominal swelling? No. Pain Score  0 *  Have you tolerated food without any problems? Yes.    Have you been able to return to your normal activities? Yes.    Do you have any questions about your discharge instructions: Diet   No. Medications  No. Follow up visit  No.  Do you have questions or concerns about your Care? No.  Actions: * If pain score is 4 or above: No action needed, pain <4.

## 2023-09-29 LAB — SURGICAL PATHOLOGY

## 2023-10-04 ENCOUNTER — Encounter: Payer: Self-pay | Admitting: Gastroenterology
